# Patient Record
Sex: Female | Born: 1990 | Race: White | Hispanic: No | Marital: Single | Smoking: Current every day smoker
Health system: Southern US, Community
[De-identification: ages and names within clinical notes are randomized; demographics above are authoritative.]

## PROBLEM LIST (undated history)

## (undated) DIAGNOSIS — N289 Disorder of kidney and ureter, unspecified: Secondary | ICD-10-CM

## (undated) DIAGNOSIS — F1910 Other psychoactive substance abuse, uncomplicated: Secondary | ICD-10-CM

## (undated) DIAGNOSIS — K8020 Calculus of gallbladder without cholecystitis without obstruction: Secondary | ICD-10-CM

## (undated) DIAGNOSIS — N831 Corpus luteum cyst of ovary, unspecified side: Secondary | ICD-10-CM

## (undated) HISTORY — PX: FRACTURE SURGERY: SHX138

## (undated) HISTORY — PX: JOINT REPLACEMENT: SHX530

## (undated) HISTORY — DX: Other psychoactive substance abuse, uncomplicated: F19.10

---
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
## 2014-06-30 ENCOUNTER — Emergency Department: Payer: Self-pay | Admitting: Emergency Medicine

## 2014-08-28 ENCOUNTER — Encounter: Payer: Self-pay | Admitting: General Practice

## 2014-08-28 ENCOUNTER — Emergency Department
Admission: EM | Admit: 2014-08-28 | Discharge: 2014-08-28 | Disposition: A | Discharge status: Home / Self Care | Payer: Self-pay | Attending: Emergency Medicine | Admitting: Emergency Medicine

## 2014-08-28 DIAGNOSIS — Y998 Other external cause status: Secondary | ICD-10-CM | POA: Insufficient documentation

## 2014-08-28 DIAGNOSIS — F419 Anxiety disorder, unspecified: Secondary | ICD-10-CM | POA: Insufficient documentation

## 2014-08-28 DIAGNOSIS — X58XXXA Exposure to other specified factors, initial encounter: Secondary | ICD-10-CM | POA: Insufficient documentation

## 2014-08-28 DIAGNOSIS — Z720 Tobacco use: Secondary | ICD-10-CM | POA: Insufficient documentation

## 2014-08-28 DIAGNOSIS — Y9389 Activity, other specified: Secondary | ICD-10-CM | POA: Insufficient documentation

## 2014-08-28 DIAGNOSIS — L02211 Cutaneous abscess of abdominal wall: Secondary | ICD-10-CM | POA: Insufficient documentation

## 2014-08-28 DIAGNOSIS — L02214 Cutaneous abscess of groin: Secondary | ICD-10-CM

## 2014-08-28 DIAGNOSIS — S0081XA Abrasion of other part of head, initial encounter: Secondary | ICD-10-CM | POA: Insufficient documentation

## 2014-08-28 DIAGNOSIS — Y9289 Other specified places as the place of occurrence of the external cause: Secondary | ICD-10-CM | POA: Insufficient documentation

## 2014-08-28 HISTORY — DX: Disorder of kidney and ureter, unspecified: N28.9

## 2014-08-28 MED ORDER — TRAMADOL HCL 50 MG PO TABS
50.0000 mg | ORAL_TABLET | Freq: Once | ORAL | Status: AC
  Administered 2014-08-28: 50 mg via ORAL

## 2014-08-28 MED ORDER — SULFAMETHOXAZOLE-TRIMETHOPRIM 800-160 MG PO TABS
ORAL_TABLET | ORAL | Status: AC
  Administered 2014-08-28: 1 via ORAL
  Filled 2014-08-28: qty 1

## 2014-08-28 MED ORDER — SULFAMETHOXAZOLE-TRIMETHOPRIM 800-160 MG PO TABS: 1.0000 | ORAL_TABLET | Freq: Two times a day (BID) | ORAL | Status: DC

## 2014-08-28 MED ORDER — TRAMADOL HCL 50 MG PO TABS: 50.0000 mg | ORAL_TABLET | Freq: Four times a day (QID) | ORAL | Status: DC | PRN

## 2014-08-28 MED ORDER — SULFAMETHOXAZOLE-TRIMETHOPRIM 800-160 MG PO TABS
1.0000 | ORAL_TABLET | Freq: Once | ORAL | Status: AC
  Administered 2014-08-28: 1 via ORAL

## 2014-08-28 MED ORDER — LIDOCAINE-EPINEPHRINE (PF) 1 %-1:200000 IJ SOLN
INTRAMUSCULAR | Status: AC
  Administered 2014-08-28: 20:00:00 via INTRADERMAL
  Filled 2014-08-28: qty 30

## 2014-08-28 MED ORDER — TRAMADOL HCL 50 MG PO TABS
ORAL_TABLET | ORAL | Status: AC
  Administered 2014-08-28: 50 mg via ORAL
  Filled 2014-08-28: qty 1

## 2014-08-28 NOTE — ED Provider Notes (Signed)
[facility] Emergency Department Provider Note  ____________________________________________  Time seen: Approximately 7:27 PM  I have reviewed the triage vital signs and the nursing notes.   HISTORY  Chief Complaint Abscess    HPI Teresa Lowe is a 24 y.o. female patient can tandem abscess to the right ankle area. Patient's status been there for 1 day. Patient states she's have abrasion to the bilateral maxillary area did not remember how they occurred. Patient state that she was taking liquid Xanax last night passed out and awakened with these complaints. Patient denies any fever she also denies any discharge from this area.  Past Medical History  Diagnosis Date  . Kidney disease     There are no active problems to display for this patient.   History reviewed. No pertinent past surgical history.  Current Outpatient Rx  Name  Route  Sig  Dispense  Refill  . sulfamethoxazole-trimethoprim (BACTRIM DS,SEPTRA DS) 800-160 MG per tablet   Oral   Take 1 tablet by mouth 2 (two) times daily.   20 tablet   0   . traMADol (ULTRAM) 50 MG tablet   Oral   Take 1 tablet (50 mg total) by mouth every 6 (six) hours as needed for moderate pain.   12 tablet   0     Allergies Review of patient's allergies indicates no known allergies.  No family history on file.  Social History History  Substance Use Topics  . Smoking status: Current Every Day Smoker -- 1.00 packs/day    Types: Cigarettes  . Smokeless tobacco: Never Used  . Alcohol Use: No    Review of Systems Constitutional: No fever/chills. Appears anxious Eyes: No visual changes. ENT: No sore throat. Cardiovascular: Denies chest pain. Respiratory: Denies shortness of breath. Gastrointestinal: No abdominal pain.  No nausea, no vomiting.  No diarrhea.  No constipation. Genitourinary: Negative for dysuria. Musculoskeletal: Negative for back pain. Skin: Negative for rash. Abscess to right inguinal  area Neurological: Negative for headaches, focal weakness or numbness.  10-point ROS otherwise negative.  ____________________________________________   PHYSICAL EXAM:  VITAL SIGNS: ED Triage Vitals  Enc Vitals Group     BP 08/28/14 1819 114/58 mmHg     Pulse Rate 08/28/14 1819 89     Resp 08/28/14 1819 17     Temp 08/28/14 1819 98.6 F (37 C)     Temp Source 08/28/14 1819 Oral     SpO2 08/28/14 1819 99 %     Weight 08/28/14 1819 140 lb (63.504 kg)     Height 08/28/14 1819 5\' 4"  (1.626 m)     Head Cir --      Peak Flow --      Pain Score 08/28/14 1819 9     Pain Loc --      Pain Edu? --      Excl. in GC? --     Constitutional: Alert and oriented. Well appearing and in no acute distress. Eyes: Conjunctivae are normal. PERRL. EOMI. Head: Atraumatic. Nose: No congestion/rhinnorhea. Mouth/Throat: Mucous membranes are moist.  Oropharynx non-erythematous. Neck: No stridor. No cervical spinal tenderness to palpation full nuchal range of motion of the neck. Hematological/Lymphatic/Immunilogical: No cervical lymphadenopathy. }Cardiovascular: Normal rate, regular rhythm. Grossly normal heart sounds.  Good peripheral circulation. Respiratory: Normal respiratory effort.  No retractions. Lungs CTAB. Gastrointestinal: Soft and nontender. No distention. No abdominal bruits. No CVA tenderness. Musculoskeletal: No lower extremity tenderness nor edema.  No joint effusions. Neurologic:  Normal speech and language.  No gross focal neurologic deficits are appreciated. Speech is normal. No gait instability. Skin:  Skin is warm, dry and intact. No rash noted.Nodular lesion right ankle area on erythematous. Abrasions to bilateral facial area.  Psychiatric: Mood and affect are normal. Speech and behavior are normal.  ____________________________________________   LABS (all labs ordered are listed, but only abnormal results are displayed)  Labs Reviewed  WOUND CULTURE    ____________________________________________  EKG   ____________________________________________  RADIOLOGY   ____________________________________________   PROCEDURES  Procedure(s) performed: See procedure note  Critical Care performed: No  _____ [facility] Emergency Department Provider Note  ____________________________________________  Time seen: Approximately 7:30 PM  I have reviewed the triage vital signs and the nursing notes.   HISTORY  Chief Complaint Abscess    HPI Teresa Lowe is a 24 y.o. female    Past Medical History  Diagnosis Date  . Kidney disease     There are no active problems to display for this patient.   History reviewed. No pertinent past surgical history.  Current Outpatient Rx  Name  Route  Sig  Dispense  Refill  . sulfamethoxazole-trimethoprim (BACTRIM DS,SEPTRA DS) 800-160 MG per tablet   Oral   Take 1 tablet by mouth 2 (two) times daily.   20 tablet   0   . traMADol (ULTRAM) 50 MG tablet   Oral   Take 1 tablet (50 mg total) by mouth every 6 (six) hours as needed for moderate pain.   12 tablet   0     Allergies Review of patient's allergies indicates no known allergies.  No family history on file.  Social History History  Substance Use Topics  . Smoking status: Current Every Day Smoker -- 1.00 packs/day    Types: Cigarettes  . Smokeless tobacco: Never Used  . Alcohol Use: No    Review of Systems Constitutional: No fever/chills Eyes: No visual changes. ENT: No sore throat. Cardiovascular: Denies chest pain. Respiratory: Denies shortness of breath. Gastrointestinal: No abdominal pain.  No nausea, no vomiting.  No diarrhea.  No constipation. Genitourinary: Negative for dysuria. Musculoskeletal: Negative for back pain. Skin: Negative for rash. Neurological: Negative for headaches, focal weakness or numbness.  10-point ROS otherwise  negative.  ____________________________________________   PHYSICAL EXAM:  VITAL SIGNS: ED Triage Vitals  Enc Vitals Group     BP 08/28/14 1819 114/58 mmHg     Pulse Rate 08/28/14 1819 89     Resp 08/28/14 1819 17     Temp 08/28/14 1819 98.6 F (37 C)     Temp Source 08/28/14 1819 Oral     SpO2 08/28/14 1819 99 %     Weight 08/28/14 1819 140 lb (63.504 kg)     Height 08/28/14 1819  (1.626 m)     Head Cir --      Peak Flow --      Pain Score 08/28/14 1819 9     Pain Loc --      Pain Edu? --      Excl. in GC? --     Constitutional: Alert and oriented. Well appearing and in no acute distress. Eyes: Conjunctivae are normal. PERRL. EOMI. Head: Atraumatic. Nose: No congestion/rhinnorhea. Mouth/Throat: Mucous membranes are moist.  Oropharynx non-erythematous. Neck: No stridor.   Cardiovascular: Normal rate, regular rhythm. Grossly normal heart sounds.  Good peripheral circulation. Respiratory: Normal respiratory effort.  No retractions. Lungs CTAB. Gastrointestinal: Soft and nontender. No distention. No abdominal bruits. No CVA tenderness.  Musculoskeletal: No lower extremity tenderness nor edema.  No joint effusions. Neurologic:  Normal speech and language. No gross focal neurologic deficits are appreciated. Speech is normal. No gait instability. Skin:  Skin is warm, dry and intact. No rash noted. Psychiatric: Mood and affect are normal. Speech and behavior are normal.  ____________________________________________   LABS (all labs ordered are listed, but only abnormal results are displayed)  Labs Reviewed  WOUND CULTURE   ____________________________________________  EKG   ____________________________________________  RADIOLOGY   ____________________________________________   PROCEDURES  Procedure(s) performed: NoneSee procedure note.  Critical Care performed: No  ____________________________________________   INITIAL IMPRESSION / ASSESSMENT AND  PLAN / ED COURSE  Pertinent labs & imaging results that were available during my care of the patient were reviewed by me and considered in my medical decision making (see chart for details).  Groin abscess  ____________________________________________   FINAL CLINICAL IMPRESSION(S) / ED DIAGNOSES  Final diagnoses:  Soft tissue abscess of inguinal region   INCISION AND DRAINAGE Performed by: Joni Reiningonald K Smith Consent: Verbal consent obtained. Risks and benefits: risks, benefits and alternatives were discussed Type: abscess  Body area:Right groin Anesthesia: local infiltration  Incision was made with a scalpel.  Local anesthetic: lidocaine 1% without epinephrine  Anesthetic total 5 ML's ml  Complexity: complex Blunt dissection to break up loculations  Drainage: purulent small  Drainage amount: 1 cc   Packing material: 1/4 in iodoform gauze  Patient tolerance: Patient tolerated the procedure well with no immediate complications.    _______________________________________   INITIAL IMPRESSION / ASSESSMENT AND PLAN / ED COURSE  Pertinent labs & imaging results that were available during my care of the patient were reviewed by me and considered in my medical decision making (see chart for details).  Right inguinal abscess ____________________________________________   FINAL CLINICAL IMPRESSION(S) / ED DIAGNOSES  Final diagnoses:  Soft tissue abscess of inguinal region      Joni ReiningRonald K Smith, PA-C 08/28/14 1957  Myrna Blazeravid Matthew Schaevitz, MD 08/28/14 2127
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
## 2014-08-28 NOTE — ED Notes (Signed)
Provider in room to assess patient at this time.  Will continue to monitor.

## 2014-08-28 NOTE — ED Notes (Signed)
Pt. Arrived to ed from home. Reports of abscess to inner right thigh. Pt reports she recognized the area this AM. Alert and Oriented.  Denies fever at home.

## 2014-08-28 NOTE — Discharge Instructions (Signed)

## 2014-09-01 LAB — WOUND CULTURE: SPECIAL REQUESTS: NORMAL

## 2014-09-02 ENCOUNTER — Emergency Department
Admission: EM | Admit: 2014-09-02 | Discharge: 2014-09-02 | Disposition: A | Discharge status: Home / Self Care | Payer: Self-pay | Attending: Emergency Medicine | Admitting: Emergency Medicine

## 2014-09-02 ENCOUNTER — Encounter: Payer: Self-pay | Admitting: Emergency Medicine

## 2014-09-02 DIAGNOSIS — Z792 Long term (current) use of antibiotics: Secondary | ICD-10-CM | POA: Insufficient documentation

## 2014-09-02 DIAGNOSIS — L02214 Cutaneous abscess of groin: Secondary | ICD-10-CM | POA: Insufficient documentation

## 2014-09-02 DIAGNOSIS — Z720 Tobacco use: Secondary | ICD-10-CM | POA: Insufficient documentation

## 2014-09-02 DIAGNOSIS — Z4801 Encounter for change or removal of surgical wound dressing: Secondary | ICD-10-CM | POA: Insufficient documentation

## 2014-09-02 NOTE — ED Provider Notes (Signed)
[facility] Emergency Department Provider Note  ____________________________________________  Time seen: Approximately 4:01 PM  I have reviewed the triage vital signs and the nursing notes.   HISTORY  Chief Complaint Wound Check   HPI Teresa Lowe is a 24 y.o. female who presents to the ER for evaluation of a wound check. Patient states she had packing placed in her right groin approximately 2 days ago. And is here to have it evaluated. Denies any complaints at this time. Patient states wound is feeling much better.   Past Medical History  Diagnosis Date  . Kidney disease     There are no active problems to display for this patient.   History reviewed. No pertinent past surgical history.  Current Outpatient Rx  Name  Route  Sig  Dispense  Refill  . sulfamethoxazole-trimethoprim (BACTRIM DS,SEPTRA DS) 800-160 MG per tablet   Oral   Take 1 tablet by mouth 2 (two) times daily.   20 tablet   0   . traMADol (ULTRAM) 50 MG tablet   Oral   Take 1 tablet (50 mg total) by mouth every 6 (six) hours as needed for moderate pain.   12 tablet   0     Allergies Review of patient's allergies indicates no known allergies.  History reviewed. No pertinent family history.  Social History History  Substance Use Topics  . Smoking status: Current Every Day Smoker -- 1.00 packs/day    Types: Cigarettes  . Smokeless tobacco: Never Used  . Alcohol Use: No    Review of System Constitutional: No fever/chills Skin: Right inguinal area wound noted. No erythema or drainage noted packing not in place at this time. Wound edges healing.  10-point ROS otherwise negative.  ____________________________________________   PHYSICAL EXAM:  VITAL SIGNS: ED Triage Vitals  Enc Vitals Group     BP 09/02/14 0833 113/67 mmHg     Pulse Rate 09/02/14 0833 77     Resp 09/02/14 0833 16     Temp 09/02/14 0833 98 F (36.7 C)     Temp src --      SpO2 09/02/14 0833 98 %      Weight 09/02/14 0833 145 lb (65.772 kg)     Height 09/02/14 0833 5\' 4"  (1.626 m)     Head Cir --      Peak Flow --      Pain Score 09/02/14 0834 7     Pain Loc --      Pain Edu? --      Excl. in GC? --     Constitutional: Alert and oriented. Well appearing and in no acute distress. Skin: Right inguinal area wound noted. No erythema or drainage noted packing not in place at this time. Wound edges healing. Psychiatric: Mood and affect are normal. Speech and behavior are normal.  ____________________________________________   LABS (all labs ordered are listed, but only abnormal results are displayed)  Labs Reviewed - No data to display ____________________________________________   ____________________________________________   PROCEDURES  Procedure(s) performed: None  Critical Care performed: No  ____________________________________________   INITIAL IMPRESSION / ASSESSMENT AND PLAN / ED COURSE  Pertinent labs & imaging results that were available during my care of the patient were reviewed by me and considered in my medical decision making (see chart for details).DISCUSSED CLINICAL FINDINGS. WOUND CONTACT WELL HEALING. PATIENT UNDERSTANDS TO RETURN TO THE ER AS NEEDED OR IF SYMPTOMS WORSEN._________________________________________   FINAL CLINICAL IMPRESSION(S) / ED DIAGNOSES  Final diagnoses:  Abscess of groin, right      Evangeline Dakin, PA-C 09/02/14 1608  Phineas Semen, MD 09/03/14 [phone]

## 2014-09-02 NOTE — ED Notes (Signed)
Here for packing removal from right thigh abcess

## 2014-09-02 NOTE — ED Notes (Signed)
Pt reports having I&D 2 days ago on abscess, here for wound check

## 2014-09-02 NOTE — Discharge Instructions (Signed)

## 2015-06-24 ENCOUNTER — Emergency Department
Admission: EM | Admit: 2015-06-24 | Discharge: 2015-06-24 | Disposition: A | Discharge status: Home / Self Care | Payer: Self-pay | Attending: Emergency Medicine | Admitting: Emergency Medicine

## 2015-06-24 ENCOUNTER — Encounter: Payer: Self-pay | Admitting: Medical Oncology

## 2015-06-24 DIAGNOSIS — Z792 Long term (current) use of antibiotics: Secondary | ICD-10-CM | POA: Insufficient documentation

## 2015-06-24 DIAGNOSIS — J101 Influenza due to other identified influenza virus with other respiratory manifestations: Secondary | ICD-10-CM | POA: Insufficient documentation

## 2015-06-24 DIAGNOSIS — F17210 Nicotine dependence, cigarettes, uncomplicated: Secondary | ICD-10-CM | POA: Insufficient documentation

## 2015-06-24 LAB — RAPID INFLUENZA A&B ANTIGENS (ARMC ONLY): INFLUENZA B (ARMC): NEGATIVE

## 2015-06-24 LAB — RAPID INFLUENZA A&B ANTIGENS: Influenza A (ARMC): POSITIVE — AB

## 2015-06-24 MED ORDER — OSELTAMIVIR PHOSPHATE 75 MG PO CAPS: 75.0000 mg | ORAL_CAPSULE | Freq: Two times a day (BID) | ORAL | Status: DC

## 2015-06-24 NOTE — Discharge Instructions (Signed)
Influenza, Adult Influenza (flu) is an infection in the mouth, nose, and throat (respiratory tract) caused by a virus. The flu can make you feel very ill. Influenza spreads easily from person to person (contagious).  HOME CARE   Only take medicines as told by your doctor.  Use a cool mist humidifier to make breathing easier.  Get plenty of rest until your fever goes away. This usually takes 3 to 4 days.  Drink enough fluids to keep your pee (urine) clear or pale yellow.  Cover your mouth and nose when you cough or sneeze.  Wash your hands well to avoid spreading the flu.  Stay home from work or school until your fever has been gone for at least 1 full day.  Get a flu shot every year. GET HELP RIGHT AWAY IF:   You have trouble breathing or feel short of breath.  Your skin or nails turn blue.  You have severe neck pain or stiffness.  You have a severe headache, facial pain, or earache.  Your fever gets worse or keeps coming back.  You feel sick to your stomach (nauseous), throw up (vomit), or have watery poop (diarrhea).  You have chest pain.  You have a deep cough that gets worse, or you cough up more thick spit (mucus). MAKE SURE YOU:   Understand these instructions.  Will watch your condition.  Will get help right away if you are not doing well or get worse.   This information is not intended to replace advice given to you by your health care provider. Make sure you discuss any questions you have with your health care provider.   Document Released: 01/05/2008 Document Revised: 04/18/2014 Document Reviewed: 06/27/2011 Elsevier Interactive Patient Education 2016 [employer]  

## 2015-06-24 NOTE — ED Notes (Signed)
Pt reports nasal congestion, sore throat and cold sx's since last night.

## 2015-06-24 NOTE — ED Provider Notes (Signed)
[facility] Emergency Department Provider Note  ____________________________________________  Time seen: Approximately 11:08 AM  I have reviewed the triage vital signs and the nursing notes.   HISTORY  Chief Complaint Sore Throat and Nasal Congestion   HPI Teresa Lowe is a 25 y.o. female this is a complaint of sudden onset of nasal drainage and sore throat last evening. Patient states she was exposed to someone with flu and is uncertain whether this is what she is experiencing or weather is cold symptoms. Patient states she has chills but is not aware of any fever. There is been no nausea, vomiting or diarrhea.Currently she rates her pain as 8 out of 10.   Past Medical History  Diagnosis Date  . Kidney disease     There are no active problems to display for this patient.   History reviewed. No pertinent past surgical history.  Current Outpatient Rx  Name  Route  Sig  Dispense  Refill  . oseltamivir (TAMIFLU) 75 MG capsule   Oral   Take 1 capsule (75 mg total) by mouth 2 (two) times daily.   10 capsule   0   . sulfamethoxazole-trimethoprim (BACTRIM DS,SEPTRA DS) 800-160 MG per tablet   Oral   Take 1 tablet by mouth 2 (two) times daily.   20 tablet   0   . traMADol (ULTRAM) 50 MG tablet   Oral   Take 1 tablet (50 mg total) by mouth every 6 (six) hours as needed for moderate pain.   12 tablet   0     Allergies Review of patient's allergies indicates no known allergies.  No family history on file.  Social History Social History  Substance Use Topics  . Smoking status: Current Every Day Smoker -- 1.00 packs/day    Types: Cigarettes  . Smokeless tobacco: Never Used  . Alcohol Use: No    Review of Systems Constitutional: No fever/positive chills Eyes: No visual changes. ENT: Positive sore throat. Cardiovascular: Denies chest pain. Respiratory: Denies shortness of breath. Gastrointestinal: No abdominal pain.  No nausea, no  vomiting.  No diarrhea.  No constipation. Genitourinary: Negative for dysuria. Musculoskeletal: Positive body aches. Skin: Negative for rash. Neurological: Negative for headaches, focal weakness or numbness.  10-point ROS otherwise negative.  ____________________________________________   PHYSICAL EXAM:  VITAL SIGNS: ED Triage Vitals  Enc Vitals Group     BP 06/24/15 1033 133/74 mmHg     Pulse Rate 06/24/15 1033 83     Resp 06/24/15 1033 17     Temp 06/24/15 1033 97.9 F (36.6 C)     Temp Source 06/24/15 1033 Oral     SpO2 06/24/15 1033 97 %     Weight 06/24/15 1033 160 lb (72.576 kg)     Height 06/24/15 1033  (1.626 m)     Head Cir --      Peak Flow --      Pain Score 06/24/15 1033 8     Pain Loc --      Pain Edu? --      Excl. in GC? --     Constitutional: Alert and oriented. Well appearing and in no acute distress. Eyes: Conjunctivae are normal. PERRL. EOMI. Head: Atraumatic. Nose: Mild congestion/rhinnorhea. Mouth/Throat: Mucous membranes are moist.  Oropharynx non-erythematous. Neck: No stridor.  Supple. Hematological/Lymphatic/Immunilogical: No cervical lymphadenopathy. Cardiovascular: Normal rate, regular rhythm. Grossly normal heart sounds.  Good peripheral circulation. Respiratory: Normal respiratory effort.  No retractions. Lungs CTAB. Gastrointestinal: Soft and nontender.  No distention. Musculoskeletal: Moves upper and lower extremities without any difficulty and normal gait was noted. Neurologic:  Normal speech and language. No gross focal neurologic deficits are appreciated. No gait instability. Skin:  Skin is warm, dry and intact. No rash noted. Psychiatric: Mood and affect are normal. Speech and behavior are normal.  ____________________________________________   LABS (all labs ordered are listed, but only abnormal results are displayed)  Labs Reviewed  RAPID INFLUENZA A&B ANTIGENS ([facility] ONLY) - Abnormal; Notable for the following:     Influenza A ([facility]) POSITIVE (*)    All other components within normal limits     PROCEDURES  Procedure(s) performed: None  Critical Care performed: No  ____________________________________________   INITIAL IMPRESSION / ASSESSMENT AND PLAN / ED COURSE  Pertinent labs & imaging results that were available during my care of the patient were reviewed by me and considered in my medical decision making (see chart for details).  Patient was informed that she is positive for influenza A. Because the time of onset she would be a candidate for Tamiflu and agrees. She is to continue taking Tylenol or Motrin as needed for fever or body aches. [facility] any continued problems. ____________________________________________   FINAL CLINICAL IMPRESSION(S) / ED DIAGNOSES  Final diagnoses:  Influenza A      Tommi RumpsRhonda L Summers, PA-C 06/24/15 1234  Phineas SemenGraydon Goodman, MD 06/24/15 [phone]

## 2015-06-24 NOTE — ED Notes (Signed)
Pt discharged home after verbalizing understanding of discharge instructions; nad noted. 

## 2015-06-24 NOTE — ED Notes (Signed)
Pt reports nasal drainage and sore throat started last night. Pt states has been exposed to someone with the flu. Redness noted to pt throat.

## 2015-08-21 ENCOUNTER — Emergency Department (HOSPITAL_COMMUNITY)
Admission: EM | Admit: 2015-08-21 | Discharge: 2015-08-21 | Disposition: A | Discharge status: Home / Self Care | Payer: Self-pay | Attending: Emergency Medicine | Admitting: Emergency Medicine

## 2015-08-21 ENCOUNTER — Other Ambulatory Visit: Payer: Self-pay

## 2015-08-21 ENCOUNTER — Emergency Department (HOSPITAL_COMMUNITY): Payer: Self-pay

## 2015-08-21 ENCOUNTER — Encounter (HOSPITAL_COMMUNITY): Payer: Self-pay | Admitting: Emergency Medicine

## 2015-08-21 DIAGNOSIS — Y92410 Unspecified street and highway as the place of occurrence of the external cause: Secondary | ICD-10-CM | POA: Insufficient documentation

## 2015-08-21 DIAGNOSIS — S32425A Nondisplaced fracture of posterior wall of left acetabulum, initial encounter for closed fracture: Secondary | ICD-10-CM | POA: Insufficient documentation

## 2015-08-21 DIAGNOSIS — S32592A Other specified fracture of left pubis, initial encounter for closed fracture: Secondary | ICD-10-CM | POA: Insufficient documentation

## 2015-08-21 DIAGNOSIS — F17200 Nicotine dependence, unspecified, uncomplicated: Secondary | ICD-10-CM | POA: Insufficient documentation

## 2015-08-21 DIAGNOSIS — S329XXA Fracture of unspecified parts of lumbosacral spine and pelvis, initial encounter for closed fracture: Secondary | ICD-10-CM

## 2015-08-21 DIAGNOSIS — S32402A Unspecified fracture of left acetabulum, initial encounter for closed fracture: Secondary | ICD-10-CM

## 2015-08-21 DIAGNOSIS — Y9389 Activity, other specified: Secondary | ICD-10-CM | POA: Insufficient documentation

## 2015-08-21 DIAGNOSIS — Z3202 Encounter for pregnancy test, result negative: Secondary | ICD-10-CM | POA: Insufficient documentation

## 2015-08-21 DIAGNOSIS — S29002A Unspecified injury of muscle and tendon of back wall of thorax, initial encounter: Secondary | ICD-10-CM | POA: Insufficient documentation

## 2015-08-21 DIAGNOSIS — Z87448 Personal history of other diseases of urinary system: Secondary | ICD-10-CM | POA: Insufficient documentation

## 2015-08-21 DIAGNOSIS — S3992XA Unspecified injury of lower back, initial encounter: Secondary | ICD-10-CM | POA: Insufficient documentation

## 2015-08-21 DIAGNOSIS — S3991XA Unspecified injury of abdomen, initial encounter: Secondary | ICD-10-CM | POA: Insufficient documentation

## 2015-08-21 DIAGNOSIS — Y998 Other external cause status: Secondary | ICD-10-CM | POA: Insufficient documentation

## 2015-08-21 HISTORY — DX: Disorder of kidney and ureter, unspecified: N28.9

## 2015-08-21 LAB — URINALYSIS, ROUTINE W REFLEX MICROSCOPIC
Bilirubin Urine: NEGATIVE
GLUCOSE, UA: NEGATIVE mg/dL
Ketones, ur: NEGATIVE mg/dL
Leukocytes, UA: NEGATIVE
Nitrite: POSITIVE — AB
Protein, ur: 100 mg/dL — AB
SPECIFIC GRAVITY, URINE: 1.025 (ref 1.005–1.030)
pH: 6 (ref 5.0–8.0)

## 2015-08-21 LAB — COMPREHENSIVE METABOLIC PANEL
ALK PHOS: 63 U/L (ref 38–126)
ALT: 43 U/L (ref 14–54)
ANION GAP: 14 (ref 5–15)
AST: 50 U/L — AB (ref 15–41)
Albumin: 3.8 g/dL (ref 3.5–5.0)
BUN: 9 mg/dL (ref 6–20)
CALCIUM: 9.6 mg/dL (ref 8.9–10.3)
CO2: 23 mmol/L (ref 22–32)
Chloride: 104 mmol/L (ref 101–111)
Creatinine, Ser: 0.64 mg/dL (ref 0.44–1.00)
GFR calc Af Amer: >60 mL/min (ref >60)
GFR calc non Af Amer: >60 mL/min (ref >60)
GLUCOSE: 135 mg/dL — AB (ref 65–99)
Potassium: 3.5 mmol/L (ref 3.5–5.1)
Sodium: 141 mmol/L (ref 135–145)
Total Bilirubin: 0.6 mg/dL (ref 0.3–1.2)
Total Protein: 7.1 g/dL (ref 6.5–8.1)

## 2015-08-21 LAB — CBC
HEMATOCRIT: 41.7 % (ref 36.0–46.0)
HEMOGLOBIN: 13.9 g/dL (ref 12.0–15.0)
MCH: 29.4 pg (ref 26.0–34.0)
MCHC: 33.3 g/dL (ref 30.0–36.0)
MCV: 88.2 fL (ref 78.0–100.0)
Platelets: 315 10*3/uL (ref 150–400)
RBC: 4.73 MIL/uL (ref 3.87–5.11)
RDW: 13.8 % (ref 11.5–15.5)
WBC: 9.3 10*3/uL (ref 4.0–10.5)

## 2015-08-21 LAB — URINE MICROSCOPIC-ADD ON

## 2015-08-21 LAB — I-STAT CG4 LACTIC ACID, ED: Lactic Acid, Venous: 1.73 mmol/L (ref 0.5–2.0)

## 2015-08-21 LAB — PROTIME-INR
INR: 1.13 (ref 0.00–1.49)
Prothrombin Time: 14.7 seconds (ref 11.6–15.2)

## 2015-08-21 LAB — ETHANOL: Alcohol, Ethyl (B): <5 mg/dL (ref <5)

## 2015-08-21 LAB — I-STAT BETA HCG BLOOD, ED (MC, WL, AP ONLY)

## 2015-08-21 MED ORDER — FENTANYL CITRATE (PF) 100 MCG/2ML IJ SOLN
100.0000 ug | Freq: Once | INTRAMUSCULAR | Status: AC
  Administered 2015-08-21: 100 ug via INTRAVENOUS

## 2015-08-21 MED ORDER — IOPAMIDOL (ISOVUE-300) INJECTION 61%
INTRAVENOUS | Status: AC
  Administered 2015-08-21: 100 mL
  Filled 2015-08-21: qty 100

## 2015-08-21 MED ORDER — FENTANYL CITRATE (PF) 100 MCG/2ML IJ SOLN
INTRAMUSCULAR | Status: AC
  Filled 2015-08-21: qty 2

## 2015-08-21 MED ORDER — ONDANSETRON HCL 4 MG/2ML IJ SOLN
INTRAMUSCULAR | Status: AC
  Filled 2015-08-21: qty 2

## 2015-08-21 MED ORDER — SODIUM CHLORIDE 0.9 % IV BOLUS (SEPSIS)
1000.0000 mL | Freq: Once | INTRAVENOUS | Status: AC
  Administered 2015-08-21: 1000 mL via INTRAVENOUS

## 2015-08-21 MED ORDER — FENTANYL CITRATE (PF) 100 MCG/2ML IJ SOLN
INTRAMUSCULAR | Status: AC
  Administered 2015-08-21: 100 ug
  Filled 2015-08-21: qty 2

## 2015-08-21 MED ORDER — FENTANYL CITRATE (PF) 100 MCG/2ML IJ SOLN
50.0000 ug | INTRAMUSCULAR | Status: DC | PRN
  Administered 2015-08-21: 50 ug via INTRAVENOUS
  Filled 2015-08-21: qty 2

## 2015-08-21 MED ORDER — HYDROMORPHONE HCL 1 MG/ML IJ SOLN
1.0000 mg | Freq: Once | INTRAMUSCULAR | Status: AC
  Administered 2015-08-21: 1 mg via INTRAVENOUS
  Filled 2015-08-21: qty 1

## 2015-08-21 MED ORDER — ONDANSETRON HCL 4 MG/2ML IJ SOLN
4.0000 mg | Freq: Once | INTRAMUSCULAR | Status: AC
  Administered 2015-08-21: 4 mg via INTRAVENOUS
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
## 2015-08-21 NOTE — Discharge Instructions (Signed)
Do not walk on your pelvic fracture.  See an orthopedic surgeon as soon as possible.

## 2015-08-21 NOTE — ED Notes (Signed)
Pt. currently at CT scan , EDP spoke with pt. regarding pt.'s plan to leave the emergency room - explained the importance of staying and have the scan done for her safety.
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
## 2015-08-21 NOTE — ED Notes (Signed)
Family at beside. Family given emotional support. 

## 2015-08-21 NOTE — ED Notes (Signed)
Pt transported to CT ?

## 2015-08-22 ENCOUNTER — Emergency Department
Admission: EM | Admit: 2015-08-22 | Discharge: 2015-08-22 | Disposition: A | Discharge status: Other Acute Inpatient Hospital | Payer: Self-pay | Attending: Emergency Medicine | Admitting: Emergency Medicine

## 2015-08-22 ENCOUNTER — Encounter: Payer: Self-pay | Admitting: Emergency Medicine

## 2015-08-22 DIAGNOSIS — N289 Disorder of kidney and ureter, unspecified: Secondary | ICD-10-CM | POA: Insufficient documentation

## 2015-08-22 DIAGNOSIS — F17210 Nicotine dependence, cigarettes, uncomplicated: Secondary | ICD-10-CM | POA: Insufficient documentation

## 2015-08-22 DIAGNOSIS — Y999 Unspecified external cause status: Secondary | ICD-10-CM | POA: Insufficient documentation

## 2015-08-22 DIAGNOSIS — Y9389 Activity, other specified: Secondary | ICD-10-CM | POA: Insufficient documentation

## 2015-08-22 DIAGNOSIS — Y92410 Unspecified street and highway as the place of occurrence of the external cause: Secondary | ICD-10-CM | POA: Insufficient documentation

## 2015-08-22 DIAGNOSIS — S32402A Unspecified fracture of left acetabulum, initial encounter for closed fracture: Secondary | ICD-10-CM | POA: Insufficient documentation

## 2015-08-22 HISTORY — DX: Calculus of gallbladder without cholecystitis without obstruction: K80.20

## 2015-08-22 MED ORDER — MORPHINE SULFATE (PF) 4 MG/ML IV SOLN
4.0000 mg | Freq: Once | INTRAVENOUS | Status: AC
  Administered 2015-08-22: 4 mg via INTRAVENOUS

## 2015-08-22 MED ORDER — MORPHINE SULFATE (PF) 4 MG/ML IV SOLN
INTRAVENOUS | Status: AC
  Filled 2015-08-22: qty 1

## 2015-08-22 MED ORDER — ONDANSETRON HCL 4 MG/2ML IJ SOLN
INTRAMUSCULAR | Status: AC
  Filled 2015-08-22: qty 2

## 2015-08-22 MED ORDER — ONDANSETRON HCL 4 MG/2ML IJ SOLN
4.0000 mg | Freq: Once | INTRAMUSCULAR | Status: AC
  Administered 2015-08-22: 4 mg via INTRAVENOUS
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
## 2015-08-22 NOTE — ED Notes (Signed)
Spoke with Dr. Scotty CourtStafford and asked him to review labs and imaging that [facility] had done at [facility]. MD reviewed results and is aware of the fracture. MD ok with patient waiting in the lobby at this time; injuries stable and patient does not need to come back in front of longest wait times per MD. First nurse made aware to monitor patient and follow up with charge as needed.

## 2015-08-24 ENCOUNTER — Encounter: Payer: Self-pay | Admitting: Medical Oncology

## 2015-10-02 DIAGNOSIS — Z9119 Patient's noncompliance with other medical treatment and regimen: Secondary | ICD-10-CM | POA: Insufficient documentation

## 2015-10-02 DIAGNOSIS — Z91199 Patient's noncompliance with other medical treatment and regimen due to unspecified reason: Secondary | ICD-10-CM | POA: Insufficient documentation

## 2015-10-02 DIAGNOSIS — S32453A Displaced transverse fracture of unspecified acetabulum, initial encounter for closed fracture: Secondary | ICD-10-CM | POA: Insufficient documentation

## 2015-10-07 ENCOUNTER — Emergency Department
Admission: EM | Admit: 2015-10-07 | Discharge: 2015-10-07 | Disposition: A | Discharge status: Home / Self Care | Payer: No Typology Code available for payment source | Attending: Emergency Medicine | Admitting: Emergency Medicine

## 2015-10-07 DIAGNOSIS — Z79899 Other long term (current) drug therapy: Secondary | ICD-10-CM | POA: Insufficient documentation

## 2015-10-07 DIAGNOSIS — G8918 Other acute postprocedural pain: Secondary | ICD-10-CM | POA: Insufficient documentation

## 2015-10-07 DIAGNOSIS — Z792 Long term (current) use of antibiotics: Secondary | ICD-10-CM | POA: Insufficient documentation

## 2015-10-07 DIAGNOSIS — F17210 Nicotine dependence, cigarettes, uncomplicated: Secondary | ICD-10-CM | POA: Insufficient documentation

## 2015-10-07 DIAGNOSIS — M25552 Pain in left hip: Secondary | ICD-10-CM | POA: Insufficient documentation

## 2015-10-07 MED ORDER — HYDROMORPHONE HCL 1 MG/ML IJ SOLN
1.0000 mg | Freq: Once | INTRAMUSCULAR | Status: AC
  Administered 2015-10-07: 1 mg via INTRAMUSCULAR
  Filled 2015-10-07: qty 1

## 2015-10-07 NOTE — ED Provider Notes (Signed)
[facility] Emergency Department Provider Note        Time seen: ----------------------------------------- 9:47 AM on 10/07/2015 -----------------------------------------    I have reviewed the triage vital signs and the nursing notes.   HISTORY  Chief Complaint Post-op Problem    HPI Teresa Lowe is a 25 y.o. female who presents to ER for intractable pain. Patient reportedly just had pelvic surgery and was [facility]. She last took oxycodone this morning, is here because of uncontrolled pain. She states is severe in the left hip, nothing makes it better or any movement of the head makes it worse.   Past Medical History  Diagnosis Date  . Kidney disease   . Renal insufficiency   . Gallstones     There are no active problems to display for this patient.   Past Surgical History  Procedure Laterality Date  . Fracture surgery      left hip  . Cesarean section      Allergies Review of patient's allergies indicates no known allergies.  Social History Social History  Substance Use Topics  . Smoking status: Current Every Day Smoker -- 1.00 packs/day    Types: Cigarettes  . Smokeless tobacco: None  . Alcohol Use: Yes     Comment: occasionally    Review of Systems Constitutional: Negative for fever. Cardiovascular: Negative for chest pain. Respiratory: Negative for shortness of breath. Gastrointestinal: Negative for abdominal pain, vomiting and diarrhea. Genitourinary: Negative for dysuria. Musculoskeletal: Positive for left hip and pelvic pain Skin: Negative for rash. Neurological: Negative for headaches, focal weakness or numbness.  10-point ROS otherwise negative.  ____________________________________________   PHYSICAL EXAM:  VITAL SIGNS: ED Triage Vitals  Enc Vitals Group     BP 10/07/15 0926 124/74 mmHg     Pulse Rate 10/07/15 0926 82     Resp 10/07/15 0926 20     Temp 10/07/15 0926 98.1 F (36.7 C)      Temp Source 10/07/15 0926 Oral     SpO2 10/07/15 0926 100 %     Weight 10/07/15 0926 150 lb (68.04 kg)     Height 10/07/15 0926 5\' 4"  (1.626 m)     Head Cir --      Peak Flow --      Pain Score 10/07/15 0926 10     Pain Loc --      Pain Edu? --      Excl. in GC? --     Constitutional: Alert and oriented. Well appearing and in no distress. Eyes: Conjunctivae are normal. PERRL. Normal extraocular movements. Cardiovascular: Normal rate, regular rhythm. No murmurs, rubs, or gallops. Respiratory: Normal respiratory effort without tachypnea nor retractions. Breath sounds are clear and equal bilaterally. No wheezes/rales/rhonchi. Gastrointestinal: Soft and nontender. Normal bowel sounds Musculoskeletal: Tenderness noted around the left pelvic area. Surgical incision sites are appreciated in the suprapubic area and around the left hip. Surgical sites are unremarkable. Pain with range of motion of the left lower extremity Neurologic:  Normal speech and language. No gross focal neurologic deficits are appreciated.  Skin:  Skin is warm, dry and intact. No rash noted. Psychiatric: Mood and affect are normal. Speech and behavior are normal.  ____________________________________________  ED COURSE:  Pertinent labs & imaging results that were available during my care of the patient were reviewed by me and considered in my medical decision making (see chart for details). Patient resisted uncontrolled pain, I will [facility], provide pain  control. ____________________________________________  FINAL ASSESSMENT AND PLAN  Postoperative pain  Plan: Patient with postoperative pain that appears to be within normal limits. She realized here that she was taking the muscle relaxant rather than the oxycodone. I have advised taking the oxycodone, she was given a shot of Dilaudid here for pain. She is stable for discharge.   Emily FilbertWilliams, Harlon Kutner E, MD   Note: This dictation was  prepared with Dragon dictation. Any transcriptional errors that result from this process are unintentional   Emily FilbertJonathan E Jonatha Gagen, MD 10/07/15 [phone]

## 2015-10-07 NOTE — ED Notes (Addendum)
Pt states that she last took oxycodone at 0800. But with reviewing medications; pt realizes that she has been taking muscle relaxer instead of oxycodone. Pt laughing when realizing that she has not been taking the prescribed oxycodone. MD at bedside.

## 2015-10-07 NOTE — Discharge Instructions (Signed)
Pain Relief Preoperatively and Postoperatively  If you have questions, problems, or concerns about the pain that you may feel after surgery, let your health care provider know. Patients have the right to assessment and management of pain. Severe pain after surgery--and the fear or anxiety associated with that pain--may cause extreme discomfort that:  · Prevents sleep.  · Decreases the ability to breathe deeply and to cough. This can result in pneumonia or other upper airway infections.  · Causes the heart to beat more quickly and the blood pressure to be higher.  · Increases the risk for constipation and bloating.  · Decreases the ability of wounds to heal.  · May result in depression, increased anxiety, and feelings of helplessness.  Relieving pain before surgery (preoperatively) is also important because it lessens pain that you have after surgery (postoperatively). Patients who receive pain relief both before and after surgery experience greater pain relief than those who receive pain relief only after surgery. Let your health care provider know if you are having uncontrolled pain. This is very important. Pain after surgery is more difficult to manage if it is severe, so receiving prompt and adequate treatment of acute pain is necessary. If you become constipated after taking pain medicine, drink more liquids if you can. Your health care provider may have you take a mild laxative.  PAIN CONTROL METHODS  Your health care providers follow policies and procedures about the management of your pain. These guidelines should be explained to you before surgery. Plans for pain control after surgery must be decided upon by you and your health care provider and put into use with your full understanding and agreement. Do not be afraid to ask questions about the care that you are receiving.  Your health care providers will attempt to control your pain in various ways, and these methods may be used together (multimodal  analgesia). Using this approach has many benefits for you, including being able to eat, move around, and leave the hospital sooner.  As-Needed Pain Control  · You may be given pain medicine through an IV tube or as a pill or liquid that you can swallow. Let your health care provider know when you are having pain, and he or she will give you the pain medicine that is ordered for you.  IV Patient-Controlled Analgesia (PCA) Pump  · You can receive your pain medicine through an IV tube that goes into one of your veins. You can control the amount of pain medicine that you get. The pain medicine is controlled by a pump. When you push the button that is hooked up to this pump, you receive a specific amount of pain medicine. This button should be pushed only by you or by someone who is specifically assigned by you to do so. It is set up to keep you from accidentally giving yourself too much pain medicine. You will be able to start using your pain pump in the recovery room after your surgery. This method can be helpful for most types of surgery.  · Tell your health care provider:    If you are having too much pain.    If you are feeling too sleepy or nauseous.  Continuous Epidural Pain Control  · A thin, soft tube (catheter) is put into your back, outside the outer layer of your spinal cord. Pain medicine flows through the catheter to lessen pain in areas of your body that are below the level of catheter placement. Continuous epidural pain control may work best for you   if you are having surgery on your abdomen, hip area, or legs. The epidural catheter is usually put into your back shortly before surgery. It is left in until you can eat, take medicine by mouth, pass urine, and have a bowel movement.  · Giving pain medicine through the epidural catheter may help you to heal more quickly because you can do these things sooner:    Regain normal bowel and bladder function.    Return to eating.    Get up and walk.  Medicine That  Numbs the Area (Local Anesthetic)  You may be given pain medicine:  · As an injection near the area of the pain (local infiltration).  · As an injection near the nerve that controls the sensation to a specific part of your body (peripheral nerve block).  · In your spine to block pain (spinal block).  · Through a local anesthetic reservoir pump. If your surgeon or anesthesiologist selects this option as a part of your pain control, one or more thin, soft tubes will be inserted into your incision site(s) at the end of surgery. These tubes will be connected to a device that is filled with a non-narcotic pain medicine. This medicine gradually empties into your incision site over the next several days. Usually, after all of the medicine is used, your health care provider will remove the tubes and throw away the device.  Opioids  · Moderate to moderately severe acute pain after surgery may respond to opioids. Opioids are narcotic pain medicine. Opioids are often combined with non-narcotic medicines to improve pain relief, lower the risk of side effects, and reduce the chance of addiction.  · If you follow your health care provider's directions about taking opioids and you do not have a history of substance abuse, your risk of becoming addicted is very small. To prevent addiction, opioids are given for short periods of time in careful doses.  Other Methods of Pain Control  · Steroids.  · Physical therapy.  · Heat and cold therapy.  · Compression, such as wrapping an elastic bandage around the area of the pain.  · Massage.     This information is not intended to replace advice given to you by your health care provider. Make sure you discuss any questions you have with your health care provider.     Document Released: 06/18/2002 Document Revised: 04/18/2014 Document Reviewed: 06/22/2010  Elsevier Interactive Patient Education ©2016 [employer]

## 2015-10-07 NOTE — ED Notes (Signed)
Pt alert and oriented X4, active, cooperative, pt in NAD. RR even and unlabored, color WNL.  Pt informed to return if any life threatening symptoms occur.   
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
## 2015-10-07 NOTE — ED Notes (Signed)
MD at bedside. 

## 2016-01-05 ENCOUNTER — Encounter: Payer: Self-pay | Admitting: Emergency Medicine

## 2016-01-05 ENCOUNTER — Emergency Department: Payer: Self-pay

## 2016-01-05 ENCOUNTER — Emergency Department
Admission: EM | Admit: 2016-01-05 | Discharge: 2016-01-06 | Disposition: A | Discharge status: Home / Self Care | Payer: Self-pay | Attending: Emergency Medicine | Admitting: Emergency Medicine

## 2016-01-05 DIAGNOSIS — M25552 Pain in left hip: Secondary | ICD-10-CM | POA: Insufficient documentation

## 2016-01-05 DIAGNOSIS — Y9389 Activity, other specified: Secondary | ICD-10-CM | POA: Insufficient documentation

## 2016-01-05 DIAGNOSIS — Y999 Unspecified external cause status: Secondary | ICD-10-CM | POA: Insufficient documentation

## 2016-01-05 DIAGNOSIS — F17210 Nicotine dependence, cigarettes, uncomplicated: Secondary | ICD-10-CM | POA: Insufficient documentation

## 2016-01-05 DIAGNOSIS — Y929 Unspecified place or not applicable: Secondary | ICD-10-CM | POA: Insufficient documentation

## 2016-01-05 LAB — POCT PREGNANCY, URINE: Preg Test, Ur: NEGATIVE

## 2016-01-05 MED ORDER — KETOROLAC TROMETHAMINE 30 MG/ML IJ SOLN
30.0000 mg | Freq: Once | INTRAMUSCULAR | Status: AC
  Administered 2016-01-05: 30 mg via INTRAVENOUS

## 2016-01-05 MED ORDER — KETOROLAC TROMETHAMINE 30 MG/ML IJ SOLN
INTRAMUSCULAR | Status: AC
  Administered 2016-01-05: 30 mg via INTRAVENOUS
  Filled 2016-01-05: qty 1
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
## 2016-01-05 NOTE — ED Notes (Signed)
Pt assisted to toilet without difficulty.
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
## 2016-01-06 MED ORDER — MORPHINE SULFATE (PF) 2 MG/ML IV SOLN
INTRAVENOUS | Status: AC
  Administered 2016-01-06: 2 mg via INTRAVENOUS
  Filled 2016-01-06: qty 1

## 2016-01-06 MED ORDER — MORPHINE SULFATE (PF) 2 MG/ML IV SOLN
2.0000 mg | Freq: Once | INTRAVENOUS | Status: AC
  Administered 2016-01-06: 2 mg via INTRAVENOUS

## 2016-01-06 MED ORDER — ACETAMINOPHEN 325 MG PO TABS
650.0000 mg | ORAL_TABLET | Freq: Once | ORAL | Status: AC
  Administered 2016-01-06: 650 mg via ORAL
  Filled 2016-01-06: qty 2

## 2016-01-06 NOTE — ED Notes (Signed)
Pt found outside entrance to ED. Pt made aware of reason and need to be in her room and that she was not up for discharge. Pt refused to come into room. Pt states she was going to wait for her ride there. Asked pt to come back inside and to her room and that first nurse would call me as soon as her ride came. Pt refused. Pt's gait steady and pt was alert and oriented x 4.

## 2016-01-06 NOTE — ED Notes (Signed)
Entered pt's room to give pt medication pt was up at the door. Pt stated I am ready to leave. Pt made a phone call previous and stated that friend was coming to get her. Informed pt it was safer for her to sit in the bed until her friend arrived to get her.

## 2016-01-06 NOTE — ED Notes (Signed)
Called by charge nurse that pt was ambulating down the hallway to the lobby. Informed charge nurse I was with another patient and Morrie Sheldonshley was not up for discharge. Charge nurse called nurse to send patient back to room.

## 2016-01-06 NOTE — ED Notes (Signed)
Pt given phone to find ride upon discharge
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
## 2016-03-01 ENCOUNTER — Emergency Department
Admission: EM | Admit: 2016-03-01 | Discharge: 2016-03-01 | Disposition: A | Discharge status: Home / Self Care | Payer: Medicaid Other | Attending: Emergency Medicine | Admitting: Emergency Medicine

## 2016-03-01 ENCOUNTER — Encounter: Payer: Self-pay | Admitting: Emergency Medicine

## 2016-03-01 DIAGNOSIS — Z79899 Other long term (current) drug therapy: Secondary | ICD-10-CM | POA: Insufficient documentation

## 2016-03-01 DIAGNOSIS — F17210 Nicotine dependence, cigarettes, uncomplicated: Secondary | ICD-10-CM | POA: Insufficient documentation

## 2016-03-01 DIAGNOSIS — N390 Urinary tract infection, site not specified: Secondary | ICD-10-CM

## 2016-03-01 LAB — POCT PREGNANCY, URINE: PREG TEST UR: NEGATIVE

## 2016-03-01 LAB — URINALYSIS COMPLETE WITH MICROSCOPIC (ARMC ONLY)
Bilirubin Urine: NEGATIVE
GLUCOSE, UA: NEGATIVE mg/dL
KETONES UR: NEGATIVE mg/dL
Nitrite: POSITIVE — AB
Protein, ur: NEGATIVE mg/dL
Specific Gravity, Urine: 1.018 (ref 1.005–1.030)
pH: 6 (ref 5.0–8.0)

## 2016-03-01 MED ORDER — SULFAMETHOXAZOLE-TRIMETHOPRIM 800-160 MG PO TABS: 1.0000 | ORAL_TABLET | Freq: Two times a day (BID) | ORAL | 0 refills | Status: DC

## 2016-03-01 NOTE — Discharge Instructions (Signed)
[facility] if any continued problems. Begin taking antibiotics twice a day for 10 days. Increase fluids. Take Tylenol or ibuprofen as needed for pain.

## 2016-03-01 NOTE — ED Provider Notes (Signed)
[facility] Emergency Department Provider Note  ____________________________________________   First MD Initiated Contact with Patient 03/01/16 1426     (approximate)  I have reviewed the triage vital signs and the nursing notes.   HISTORY  Chief Complaint Urinary Tract Infection and Exposure to STD   HPI Teresa Lowe is a 25 y.o. female is here with complaint of urinary tract infection. Patient states that her urine has been very dark and has bad odor. Patient states that she has a history of urinary tract infections with the last one being last year. She denies any vaginal discharge. She also complains of a swollen area behind both ears that she also wants to have checked. She denies any nausea, vomiting, diarrhea. There is been no fever or chills.   Past Medical History:  Diagnosis Date  . Gallstones   . Kidney disease   . Renal insufficiency     There are no active problems to display for this patient.   Past Surgical History:  Procedure Laterality Date  . CESAREAN SECTION    . FRACTURE SURGERY     left hip    Prior to Admission medications   Medication Sig Start Date End Date Taking? Authorizing Provider  buprenorphine-naloxone (SUBOXONE) 8-2 MG SUBL SL tablet Place 1 tablet under the tongue daily.   Yes Historical Provider, MD  sulfamethoxazole-trimethoprim (BACTRIM DS,SEPTRA DS) 800-160 MG tablet Take 1 tablet by mouth 2 (two) times daily. 03/01/16   Tommi Rumpshonda L Sherrelle Prochazka, PA-C    Allergies Patient has no known allergies.  No family history on file.  Social History Social History  Substance Use Topics  . Smoking status: Current Every Day Smoker    Packs/day: 1.00    Types: Cigarettes  . Smokeless tobacco: Never Used  . Alcohol use Yes     Comment: occasionally    Review of Systems Constitutional: No fever/chills Cardiovascular: Denies chest pain. Respiratory: Denies shortness of breath. Gastrointestinal:  No nausea, no  vomiting.  Genitourinary:Positive urinary symptoms. Denies vaginal discharge. Musculoskeletal: Negative for back pain. Skin: Negative for rash. Neurological: Negative for headaches, focal weakness or numbness.  10-point ROS otherwise negative.  ____________________________________________   PHYSICAL EXAM:  VITAL SIGNS: ED Triage Vitals  Enc Vitals Group     BP 03/01/16 1349 (!) 127/94     Pulse Rate 03/01/16 1349 83     Resp 03/01/16 1349 18     Temp 03/01/16 1349 97.7 F (36.5 C)     Temp Source 03/01/16 1349 Oral     SpO2 03/01/16 1349 100 %     Weight 03/01/16 1350 150 lb (68 kg)     Height 03/01/16 1350 5\' 4"  (1.626 m)     Head Circumference --      Peak Flow --      Pain Score --      Pain Loc --      Pain Edu? --      Excl. in GC? --     Constitutional: Alert and oriented. Well appearing and in no acute distress. Eyes: Conjunctivae are normal. PERRL. EOMI. Head: Atraumatic. Nose: No congestion/rhinnorhea.   EACs and TMs clear bilaterally. Neck: No stridor.   Minimal bilateral lymphadenopathy present postauricular. Nontender. Cardiovascular: Normal rate, regular rhythm. Grossly normal heart sounds.  Good peripheral circulation. Respiratory: Normal respiratory effort.  No retractions. Lungs CTAB. Gastrointestinal: Soft and nontender. No distention. No CVA tenderness. Musculoskeletal: No lower extremity tenderness nor edema.  No joint effusions. Neurologic:  Normal speech and language. No gross focal neurologic deficits are appreciated. No gait instability. Skin:  Skin is warm, dry and intact. No rash noted. No areas of infection or abrasions seen on the scalp to account for the adenopathy postauricular. Psychiatric: Mood and affect are normal. Speech and behavior are normal.  ____________________________________________   LABS (all labs ordered are listed, but only abnormal results are displayed)  Labs Reviewed  URINALYSIS COMPLETEWITH MICROSCOPIC ([facility] ONLY)  - Abnormal; Notable for the following:       Result Value   Color, Urine AMBER (*)    APPearance CLOUDY (*)    Hgb urine dipstick 3+ (*)    Nitrite POSITIVE (*)    Leukocytes, UA TRACE (*)    Bacteria, UA MANY (*)    Squamous Epithelial / LPF 0-5 (*)    All other components within normal limits  POC URINE PREG, ED  POCT PREGNANCY, URINE     PROCEDURES  Procedure(s) performed: None  Procedures  Critical Care performed: No  ____________________________________________   INITIAL IMPRESSION / ASSESSMENT AND PLAN / ED COURSE  Pertinent labs & imaging results that were available during my care of the patient were reviewed by me and considered in my medical decision making (see chart for details).    Clinical Course    Patient was started on Septra DS twice a day for 10 days. She is to increase fluids. She'll follow-up with the health department or [facility] if any continued problems.   ____________________________________________   FINAL CLINICAL IMPRESSION(S) / ED DIAGNOSES  Final diagnoses:  Acute urinary tract infection      NEW MEDICATIONS STARTED DURING THIS VISIT:  Discharge Medication List as of 03/01/2016  3:00 PM       Note:  This document was prepared using Dragon voice recognition software and may include unintentional dictation errors.    Tommi RumpsRhonda L Amiayah Giebel, PA-C 03/01/16 1536    Jennye MoccasinBrian S Quigley, MD 03/01/16 1540

## 2016-03-01 NOTE — ED Triage Notes (Signed)
Pt wants to be checked for possible UTI and possible std. States her urine is very dark and has an odor.

## 2016-03-01 NOTE — ED Notes (Signed)
See triage note  "I think I have a UTI  "  Positive dysuria and freq  Also noticed her urine being dark for the past few days   Then noticed a swollen area behind left ear today

## 2016-03-21 ENCOUNTER — Encounter: Payer: Self-pay | Admitting: Emergency Medicine

## 2016-03-21 ENCOUNTER — Emergency Department
Admission: EM | Admit: 2016-03-21 | Discharge: 2016-03-21 | Disposition: A | Discharge status: Home / Self Care | Payer: Self-pay | Attending: Emergency Medicine | Admitting: Emergency Medicine

## 2016-03-21 DIAGNOSIS — Y939 Activity, unspecified: Secondary | ICD-10-CM | POA: Insufficient documentation

## 2016-03-21 DIAGNOSIS — Y929 Unspecified place or not applicable: Secondary | ICD-10-CM | POA: Insufficient documentation

## 2016-03-21 DIAGNOSIS — Y999 Unspecified external cause status: Secondary | ICD-10-CM | POA: Insufficient documentation

## 2016-03-21 DIAGNOSIS — F17210 Nicotine dependence, cigarettes, uncomplicated: Secondary | ICD-10-CM | POA: Insufficient documentation

## 2016-03-21 DIAGNOSIS — W260XXA Contact with knife, initial encounter: Secondary | ICD-10-CM | POA: Insufficient documentation

## 2016-03-21 DIAGNOSIS — S61210A Laceration without foreign body of right index finger without damage to nail, initial encounter: Secondary | ICD-10-CM | POA: Insufficient documentation

## 2016-03-21 MED ORDER — LIDOCAINE HCL (PF) 1 % IJ SOLN
5.0000 mL | Freq: Once | INTRAMUSCULAR | Status: DC
  Filled 2016-03-21: qty 5

## 2016-03-21 NOTE — Discharge Instructions (Signed)
Keep the wound clean, dry, and covered. Wash only with soap & water. Follow-up with a local urgent care center ([facility], [facility], etc.) for suture removal in 10-12 days.

## 2016-03-21 NOTE — ED Triage Notes (Signed)
States she cut left index finger with knife

## 2016-03-21 NOTE — ED Provider Notes (Signed)
[facility] Emergency Department Provider Note ____________________________________________  Time seen: 1521  I have reviewed the triage vital signs and the nursing notes.  HISTORY  Chief Complaint  Laceration  HPI Teresa Lowe is a 25 y.o. female presents to the ED for treatment of an accidental laceration to the right index finger. She describes cutting a package of roast beef, when the knife slipped, cutting her across the palmer aspect of the right index. She reports a current tetanus status.   Past Medical History:  Diagnosis Date  . Gallstones   . Kidney disease   . Renal insufficiency     There are no active problems to display for this patient.   Past Surgical History:  Procedure Laterality Date  . CESAREAN SECTION    . FRACTURE SURGERY     left hip    Prior to Admission medications   Medication Sig Start Date End Date Taking? Authorizing Provider  buprenorphine-naloxone (SUBOXONE) 8-2 MG SUBL SL tablet Place 1 tablet under the tongue daily.    Historical Provider, MD  sulfamethoxazole-trimethoprim (BACTRIM DS,SEPTRA DS) 800-160 MG tablet Take 1 tablet by mouth 2 (two) times daily. 03/01/16   Tommi Rumpshonda L Summers, PA-C    Allergies Patient has no known allergies.  No family history on file.  Social History Social History  Substance Use Topics  . Smoking status: Current Every Day Smoker    Packs/day: 1.00    Types: Cigarettes  . Smokeless tobacco: Never Used  . Alcohol use Yes     Comment: occasionally    Review of Systems  Constitutional: Negative for fever. Musculoskeletal: Negative for back pain. Skin: Negative for rash. Right finger lac as above Neurological: Negative for headaches, focal weakness or numbness. ____________________________________________  PHYSICAL EXAM:  VITAL SIGNS: ED Triage Vitals  Enc Vitals Group     BP 03/21/16 1524 130/78     Pulse Rate 03/21/16 1524 88     Resp 03/21/16 1524 20     Temp  03/21/16 1524 97 F (36.1 C)     Temp Source 03/21/16 1524 Oral     SpO2 03/21/16 1524 97 %     Weight 03/21/16 1517 150 lb (68 kg)     Height 03/21/16 1517 5\' 4"  (1.626 m)     Head Circumference --      Peak Flow --      Pain Score 03/21/16 1517 7     Pain Loc --      Pain Edu? --      Excl. in GC? --     Constitutional: Alert and oriented. Well appearing and in no distress. Head: Normocephalic and atraumatic. Cardiovascular: Normal distal pulses. Respiratory: Normal respiratory effort.  Musculoskeletal: Normal composite fist. Normal right index finger flex/ext ROM. Nontender with normal range of motion in all extremities.  Neurologic:  Normal gross sensation. Normal intrinsic/opposition testing. Normal speech and language. No gross focal neurologic deficits are appreciated. Skin:  Skin is warm, dry and intact. No rash noted. Right index with a linear lac across the volar aspect of the middle phalanx.  ____________________________________________  PROCEDURES  LACERATION REPAIR Performed by: Lissa HoardMenshew, [place] Hagger V Bacon Authorized by: Lissa HoardMenshew, Milessa Hogan V Bacon Consent: Verbal consent obtained. Risks and benefits: risks, benefits and alternatives were discussed Consent given by: patient Patient identity confirmed: provided demographic data Prepped and Draped in normal sterile fashion Wound explored  Laceration Location: right index finger  Laceration Length: 1.2 cm  No Foreign Bodies seen or palpated  Anesthesia: transthecal infiltration  Local anesthetic: lidocaine 1% w/o epinephrine  Anesthetic total: 2 ml  Irrigation method: syringe Amount of cleaning: standard  Skin closure: 5-0 nylon  Number of sutures: 3  Technique: interrupted  Patient tolerance: Patient tolerated the procedure well with no immediate complications. ____________________________________________  INITIAL IMPRESSION / ASSESSMENT AND PLAN / ED COURSE  Patient with a finger laceration s/p suture  repair. She is given wound care instructions. She will follow-up with a local urgent care for suture removal in 10-12 days.   Clinical Course    ____________________________________________  FINAL CLINICAL IMPRESSION(S) / ED DIAGNOSES  Final diagnoses:  Laceration of right index finger without foreign body without damage to nail, initial encounter     Lissa HoardJenise V Bacon Leathia Farnell, PA-C 03/21/16 1619    Nita Sicklearolina Veronese, MD 03/23/16 1039

## 2016-05-13 ENCOUNTER — Ambulatory Visit (INDEPENDENT_AMBULATORY_CARE_PROVIDER_SITE_OTHER): Payer: Self-pay | Admitting: Certified Nurse Midwife

## 2016-05-13 ENCOUNTER — Other Ambulatory Visit: Payer: Self-pay | Admitting: Certified Nurse Midwife

## 2016-05-13 ENCOUNTER — Other Ambulatory Visit (INDEPENDENT_AMBULATORY_CARE_PROVIDER_SITE_OTHER): Payer: Self-pay

## 2016-05-13 ENCOUNTER — Encounter: Payer: Self-pay | Admitting: Certified Nurse Midwife

## 2016-05-13 VITALS — BP 105/63 | HR 103 | Ht 64.0 in | Wt 178.3 lb

## 2016-05-13 DIAGNOSIS — Z87898 Personal history of other specified conditions: Secondary | ICD-10-CM

## 2016-05-13 DIAGNOSIS — Z30431 Encounter for routine checking of intrauterine contraceptive device: Secondary | ICD-10-CM

## 2016-05-13 DIAGNOSIS — F1911 Other psychoactive substance abuse, in remission: Secondary | ICD-10-CM

## 2016-05-13 DIAGNOSIS — Z789 Other specified health status: Secondary | ICD-10-CM

## 2016-05-13 DIAGNOSIS — N644 Mastodynia: Secondary | ICD-10-CM

## 2016-05-13 DIAGNOSIS — R102 Pelvic and perineal pain: Secondary | ICD-10-CM

## 2016-05-13 DIAGNOSIS — Z124 Encounter for screening for malignant neoplasm of cervix: Secondary | ICD-10-CM

## 2016-05-13 DIAGNOSIS — R319 Hematuria, unspecified: Secondary | ICD-10-CM

## 2016-05-13 DIAGNOSIS — N926 Irregular menstruation, unspecified: Secondary | ICD-10-CM

## 2016-05-13 LAB — POCT URINALYSIS DIPSTICK
Bilirubin, UA: NEGATIVE
Glucose, UA: NEGATIVE
KETONES UA: NEGATIVE
Leukocytes, UA: NEGATIVE
NITRITE UA: POSITIVE
PH UA: 7
Spec Grav, UA: 1.015
Urobilinogen, UA: NEGATIVE

## 2016-05-13 LAB — POCT URINE PREGNANCY: Preg Test, Ur: NEGATIVE

## 2016-05-13 MED ORDER — NITROFURANTOIN MONOHYD MACRO 100 MG PO CAPS: 100.0000 mg | ORAL_CAPSULE | Freq: Two times a day (BID) | ORAL | 0 refills | Status: DC

## 2016-05-13 NOTE — Progress Notes (Signed)
ANNUAL PREVENTATIVE CARE GYN  ENCOUNTER NOTE  Subjective:       Teresa Lowe is a 26 y.o. G82P1001 female here for a routine annual gynecologic exam.  Current complaints: irregular menses, breast pain, and pelvic pain.   She states that her periods are irregular, but is unable to provide further details.   She reports intermittent lower abdominal pain for the last few months not associated with her menstrual cycle.   Maryjo questions her fertility and contraception. When her baby was born via c-section in 2009, she states that they offered her an IUD immediately. Deseree is unable to remember if she consented to IUD placement. She believes she does have an IUD since she and her boyfriend have been having unprotected sex for the last six or seven years without conceiving.   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   Family history: breast cancer (grandmother) and prostate cancer (grandfather).    Denies difficulty breathing or respiratory distress, visual changes, chest pain, dysuria, and leg pain or swelling.    Gynecologic History  Patient's last menstrual period was 05/09/2016 (exact date).   Contraception: none and pt may have IUD, unsure   Last Pap: last year. Results were: normal at [facility], unable to find results in Care Everywhere.   Obstetric History OB History  Gravida Para Term Preterm AB Living  1 1 1  0 0 1  SAB TAB Ectopic Multiple Live Births  0 0 0   1    # Outcome Date GA Lbr Len/2nd Weight Sex Delivery Anes PTL Lv  1 Term 2009    M CS-LTranv   LIV      Past Medical History:  Diagnosis Date  . Drug abuse   . Gallstones   . Kidney disease   . Renal insufficiency     Past Surgical History:  Procedure Laterality Date  . CESAREAN SECTION    . FRACTURE SURGERY     left hip    No current outpatient prescriptions on file prior to visit.   No current facility-administered medications  on file prior to visit.     No Known Allergies  Social History   Social History  . Marital status: Single    Spouse name: N/A  . Number of children: N/A  . Years of education: N/A   Occupational History  . Not on file.   Social History Main Topics  . Smoking status: Current Every Day Smoker    Packs/day: 1.00    Types: Cigarettes  . Smokeless tobacco: Never Used  . Alcohol use No  . Drug use: No  . Sexual activity: Yes    Birth control/ protection: None   Other Topics Concern  . Not on file   Social History Narrative   ** Merged History Encounter **        Family History  Problem Relation Age of Onset  . Breast cancer Paternal Grandmother   . Diabetes Paternal Grandmother   . Ovarian cancer Neg Hx   . Colon cancer Neg Hx     The following portions of the patient's history were reviewed and updated as appropriate: allergies, current medications, past family history, past medical history, past social history, past surgical history and problem list.  Review of Systems  ROS negative except for as noted above. Information obtained from patient.    Objective:   BP 105/63   Pulse (!) 103  Ht 5\' 4"  (1.626 m)   Wt 178 lb 4.8 oz (80.9 kg)   LMP 05/09/2016 (Exact Date)   BMI 30.61 kg/m    CONSTITUTIONAL: Well-developed, well-nourished female disheveled appearance  NEUROLGIC: Alert and oriented to person, place, and time. Normal muscle tone coordination.   HENT:  Normocephalic, atraumatic, External right and left ear normal.   EYES: Conjunctivae and EOM are normal.   NECK: Normal range of motion, supple, no masses.  Normal thyroid.   SKIN: Skin is warm and dry. No rash noted. Not diaphoretic. No erythema. No pallor.  CARDIOVASCULAR: Normal heart rate noted, regular rhythm, no murmur.  RESPIRATORY: Clear to auscultation bilaterally.   BREASTS: Symmetric in size. No skin changes, nipple drainage, or lymphadenopathy. Pea sized mobile mass left side one  o'clock,   ABDOMEN: Soft, normal bowel sounds, no distention noted.  No tenderness, rebound or guarding.   PELVIC:  External Genitalia: Normal  Vagina: Normal  Cervix: Normal, yellow discharge present.  Uterus: Normal  Adnexa: Normal  MUSCULOSKELETAL: Normal range of motion. No tenderness.  No cyanosis, clubbing, or edema.  2+ distal pulses.  LYMPHATIC: No Axillary, Supraclavicular, or Inguinal Adenopathy.  Assessment:   Annual gynecologic examination 26 y.o. Contraception: none   Obesity 1   Problem List Items Addressed This Visit    None    Visit Diagnoses    Irregular menses    -  Primary   Relevant Orders   POCT urine pregnancy (Completed)   History of drug abuse       Relevant Orders   Monitor Drug Profile 14(MW)   Pelvic pain       Relevant Orders   POCT urinalysis dipstick (Completed)   Urine culture   NuSwab Vaginitis Plus (VG+)   Hematuria, unspecified type       Relevant Orders   Urine culture   Screening for cervical cancer       Relevant Orders   Pap IG w/ reflex to HPV when ASC-U      Plan:  Pap: Pap, Reflex if ASCUS   NuSwab, urine dip, urine pregnancy, and urine drug collected  Advised pt to monitor breast mass for changes over the next three (3) months, decrease caffeine, increase water, and take vitamin E supplement  Labs: None today, will follow up after US reviewed   Routine preventative health maintenance measures emphasized: Exercise/Diet/Weight control, Tobacco Warnings and Alcohol/Substance use risks  US for pelvic pain and possible IUD  Return to Clinic - 3 months or sooner if needed   Gunnar BullaJenkins Michelle Lawhorn, CNM

## 2016-05-16 LAB — URINE CULTURE

## 2016-05-16 NOTE — Progress Notes (Signed)
Would you please call the patient with results. If she would like yearly lab work, then we can place the order and she can come in at anytime. Thanks, JML

## 2016-05-18 LAB — NUSWAB VAGINITIS PLUS (VG+)
ATOPOBIUM VAGINAE: HIGH {score} — AB
BVAB 2: HIGH Score — AB
CANDIDA ALBICANS, NAA: POSITIVE — AB
CANDIDA GLABRATA, NAA: NEGATIVE
CHLAMYDIA TRACHOMATIS, NAA: NEGATIVE
Megasphaera 1: HIGH Score — AB
NEISSERIA GONORRHOEAE, NAA: NEGATIVE
TRICH VAG BY NAA: NEGATIVE

## 2016-05-19 ENCOUNTER — Other Ambulatory Visit: Payer: Self-pay

## 2016-05-19 ENCOUNTER — Telehealth: Payer: Self-pay

## 2016-05-19 DIAGNOSIS — E663 Overweight: Secondary | ICD-10-CM

## 2016-05-19 DIAGNOSIS — N926 Irregular menstruation, unspecified: Secondary | ICD-10-CM

## 2016-05-19 MED ORDER — METRONIDAZOLE 500 MG PO TABS: 500.0000 mg | ORAL_TABLET | Freq: Two times a day (BID) | ORAL | 0 refills | Status: DC

## 2016-05-19 MED ORDER — TERCONAZOLE 0.4 % VA CREA: 1.0000 | TOPICAL_CREAM | Freq: Every day | VAGINAL | 0 refills | Status: DC

## 2016-05-19 NOTE — Progress Notes (Signed)
Would also treat her with Flagyl and Terazol, please. Thanks, JML

## 2016-05-19 NOTE — Telephone Encounter (Signed)
-----   Message from Gunnar BullaJenkins Michelle Lawhorn, CNM sent at 05/19/2016 10:48 AM EST ----- Would also treat her with Flagyl and Terazol, please. Thanks, JML

## 2016-05-19 NOTE — Telephone Encounter (Signed)
Pt aware of lab results. Meds erx. Pt wanted an appt to discuss getting pregnant. Made for 2/15 at 1:45.

## 2016-05-20 ENCOUNTER — Encounter: Payer: Self-pay | Admitting: Emergency Medicine

## 2016-05-20 ENCOUNTER — Other Ambulatory Visit: Payer: Self-pay

## 2016-05-20 ENCOUNTER — Emergency Department
Admission: EM | Admit: 2016-05-20 | Discharge: 2016-05-20 | Disposition: A | Discharge status: Home / Self Care | Payer: Self-pay | Attending: Emergency Medicine | Admitting: Emergency Medicine

## 2016-05-20 ENCOUNTER — Other Ambulatory Visit: Payer: Self-pay | Admitting: Obstetrics and Gynecology

## 2016-05-20 DIAGNOSIS — N3000 Acute cystitis without hematuria: Secondary | ICD-10-CM | POA: Insufficient documentation

## 2016-05-20 DIAGNOSIS — E663 Overweight: Secondary | ICD-10-CM

## 2016-05-20 DIAGNOSIS — F17210 Nicotine dependence, cigarettes, uncomplicated: Secondary | ICD-10-CM | POA: Insufficient documentation

## 2016-05-20 DIAGNOSIS — N926 Irregular menstruation, unspecified: Secondary | ICD-10-CM

## 2016-05-20 LAB — PAP IG W/ RFLX HPV ASCU: PAP SMEAR COMMENT: 0

## 2016-05-20 MED ORDER — NITROFURANTOIN MONOHYD MACRO 100 MG PO CAPS: 100.0000 mg | ORAL_CAPSULE | Freq: Two times a day (BID) | ORAL | 0 refills | Status: DC

## 2016-05-20 NOTE — ED Triage Notes (Signed)
Pt c/o white vaginal discharge and some itching. Also reports her "pee is a golden color". No dysuria or increased frequency. Denies fevers.

## 2016-05-20 NOTE — ED Notes (Signed)
AAOx3.  Skin warm and dry.  NAD 

## 2016-05-20 NOTE — ED Provider Notes (Signed)
[facility] Emergency Department Provider Note  ____________________________________________  Time seen: Approximately 4:39 PM  I have reviewed the triage vital signs and the nursing notes.   HISTORY  Chief Complaint vaginal discharge/itching    HPI Teresa Lowe is a 26 y.o. female that presents to the emergency department after failing to take nitrofurantoin for urinary tract infection. Patient states that she was seen at [facility] care 1 week ago for yeast infection and urinary tract infection. Patient just started taking her medication for the yeast infection and lost the medication for the urinary tract infection. Patient denies any other symptoms. No fever, chills, cough, shortness of breath, chest pain, nausea, vomiting, abdominal pain, back pain.   Past Medical History:  Diagnosis Date  . Drug abuse   . Gallstones   . Kidney disease   . Renal insufficiency     There are no active problems to display for this patient.   Past Surgical History:  Procedure Laterality Date  . CESAREAN SECTION    . FRACTURE SURGERY     left hip    Prior to Admission medications   Medication Sig Start Date End Date Taking? Authorizing Provider  metroNIDAZOLE (FLAGYL) 500 MG tablet Take 1 tablet (500 mg total) by mouth 2 (two) times daily. 05/19/16   Prentice Docker Defrancesco, MD  nitrofurantoin, macrocrystal-monohydrate, (MACROBID) 100 MG capsule Take 1 capsule (100 mg total) by mouth 2 (two) times daily. 05/20/16 05/27/16  Enid Derry, PA-C  terconazole (TERAZOL 7) 0.4 % vaginal cream Place 1 applicator vaginally at bedtime. For seven nights. 05/19/16   Prentice Docker Defrancesco, MD    Allergies Amoxicillin  Family History  Problem Relation Age of Onset  . Breast cancer Paternal Grandmother   . Diabetes Paternal Grandmother   . Ovarian cancer Neg Hx   . Colon cancer Neg Hx     Social History Social History  Substance Use Topics  . Smoking status: Current Every Day  Smoker    Packs/day: 1.00    Types: Cigarettes  . Smokeless tobacco: Never Used  . Alcohol use No     Review of Systems  Constitutional: No fever/chills ENT: No upper respiratory complaints. Cardiovascular: No chest pain. Respiratory: No cough. No SOB. Gastrointestinal: No abdominal pain.  No nausea, no vomiting.  Musculoskeletal: Negative for musculoskeletal pain. Skin: Negative for rash, abrasions, lacerations, ecchymosis. Neurological: Negative for headaches, numbness or tingling   ____________________________________________   PHYSICAL EXAM:  VITAL SIGNS: ED Triage Vitals  Enc Vitals Group     BP 05/20/16 1605 112/73     Pulse Rate 05/20/16 1605 73     Resp 05/20/16 1605 18     Temp 05/20/16 1605 97.7 F (36.5 C)     Temp Source 05/20/16 1605 Oral     SpO2 05/20/16 1605 100 %     Weight 05/20/16 1603 178 lb (80.7 kg)     Height 05/20/16 1603 5\' 4"  (1.626 m)     Head Circumference --      Peak Flow --      Pain Score --      Pain Loc --      Pain Edu? --      Excl. in GC? --      Constitutional: Alert. Well appearing and in no acute distress. Eyes: Conjunctivae are normal. PERRL. EOMI. Head: Atraumatic. ENT:      Ears:      Nose: No congestion/rhinnorhea.      Mouth/Throat: Mucous membranes are  moist.  Neck: No stridor.   Cardiovascular: Normal rate, regular rhythm.  Good peripheral circulation. Respiratory: Normal respiratory effort without tachypnea or retractions. Lungs CTAB. Good air entry to the bases with no decreased or absent breath sounds. Gastrointestinal: Bowel sounds 4 quadrants. Soft and nontender to palpation. No guarding or rigidity. No palpable masses. No distention. No CVA tenderness. Musculoskeletal: Full range of motion to all extremities. No gross deformities appreciated. Neurologic:  Normal speech and language. No gross focal neurologic deficits are appreciated.  Skin:  Skin is warm, dry and intact. No rash  noted.   ____________________________________________   LABS (all labs ordered are listed, but only abnormal results are displayed)  Labs Reviewed - No data to display ____________________________________________  EKG   ____________________________________________  RADIOLOGY  No results found.  ____________________________________________    PROCEDURES  Procedure(s) performed:    Procedures    Medications - No data to display   ____________________________________________   INITIAL IMPRESSION / ASSESSMENT AND PLAN / ED COURSE  Pertinent labs & imaging results that were available during my care of the patient were reviewed by me and considered in my medical decision making (see chart for details).  Review of the [place] CSRS was performed in accordance of the [facility] prior to dispensing any controlled drugs.     Patient's diagnosis is consistent with urinary tract infection. Vital signs are reassuring. Very suspicious for ingesting mind altering medications before coming to ED. I do not think urinalysis is indicated at this time since patient never took antibiotics for urinary tract infection. No systemic symptoms. Patient just started medication for yeast infection. Patient will be discharged home with prescriptions for Macrobid. Patient is to follow up with PCP as directed. Patient is given ED precautions to return to the ED for any worsening or new symptoms.     ____________________________________________  FINAL CLINICAL IMPRESSION(S) / ED DIAGNOSES  Final diagnoses:  Acute cystitis without hematuria      NEW MEDICATIONS STARTED DURING THIS VISIT:  Discharge Medication List as of 05/20/2016  4:39 PM          This chart was dictated using voice recognition software/Dragon. Despite best efforts to proofread, errors can occur which can change the meaning. Any change was purely unintentional.    Enid Derryshley Brynden Thune, PA-C 05/20/16 1850    Governor Rooksebecca Lord,  MD 05/20/16 2111

## 2016-05-20 NOTE — ED Notes (Signed)
See triage note  States she was seen yesterday and given rx for UTI and vaginosis   States she has the flagyl and vaginal cream  But lost the pills for uti

## 2016-05-21 LAB — COMPREHENSIVE METABOLIC PANEL
A/G RATIO: 1.5 (ref 1.2–2.2)
ALK PHOS: 90 IU/L (ref 39–117)
ALT: 23 IU/L (ref 0–32)
AST: 13 IU/L (ref 0–40)
Albumin: 4.3 g/dL (ref 3.5–5.5)
BUN/Creatinine Ratio: 22 (ref 9–23)
BUN: 11 mg/dL (ref 6–20)
Bilirubin Total: 0.3 mg/dL (ref 0.0–1.2)
CALCIUM: 9.2 mg/dL (ref 8.7–10.2)
CO2: 22 mmol/L (ref 18–29)
CREATININE: 0.49 mg/dL — AB (ref 0.57–1.00)
Chloride: 98 mmol/L (ref 96–106)
GFR calc Af Amer: 159 mL/min/{1.73_m2} (ref >59)
GFR, EST NON AFRICAN AMERICAN: 138 mL/min/{1.73_m2} (ref >59)
Globulin, Total: 2.8 g/dL (ref 1.5–4.5)
Glucose: 93 mg/dL (ref 65–99)
POTASSIUM: 4.3 mmol/L (ref 3.5–5.2)
Sodium: 138 mmol/L (ref 134–144)
Total Protein: 7.1 g/dL (ref 6.0–8.5)

## 2016-05-21 LAB — LIPID PANEL
Chol/HDL Ratio: 3.2 ratio units (ref 0.0–4.4)
Cholesterol, Total: 148 mg/dL (ref 100–199)
HDL: 46 mg/dL (ref >39)
LDL Calculated: 74 mg/dL (ref 0–99)
Triglycerides: 142 mg/dL (ref 0–149)
VLDL Cholesterol Cal: 28 mg/dL (ref 5–40)

## 2016-05-21 LAB — HEMOGLOBIN A1C
ESTIMATED AVERAGE GLUCOSE: 105 mg/dL
HEMOGLOBIN A1C: 5.3 % (ref 4.8–5.6)

## 2016-05-21 LAB — TSH: TSH: 1.93 u[IU]/mL (ref 0.450–4.500)

## 2016-05-21 LAB — VITAMIN D 25 HYDROXY (VIT D DEFICIENCY, FRACTURES): Vit D, 25-Hydroxy: 23.9 ng/mL — ABNORMAL LOW (ref 30.0–100.0)

## 2016-05-23 LAB — BUPRENORPHINE CONFIRM, URINE
BUPRENORPHINE CONFIRM: 126 ng/mL
Buprenorphine: POSITIVE — AB
Buprenorphine: POSITIVE — AB
NORBUPRENORPHINE: POSITIVE — AB
Norbuprenorphine Confirm: 30 ng/mL

## 2016-05-23 LAB — MONITOR DRUG PROFILE 14(MW)
AMPHETAMINE SCREEN URINE: NEGATIVE ng/mL
BARBITURATE SCREEN URINE: NEGATIVE ng/mL
BENZODIAZEPINE SCREEN, URINE: NEGATIVE ng/mL
CANNABINOIDS UR QL SCN: NEGATIVE ng/mL
Cocaine (Metab) Scrn, Ur: NEGATIVE ng/mL
Creatinine(Crt), U: 123.8 mg/dL (ref 20.0–300.0)
FENTANYL, URINE: NEGATIVE pg/mL
Meperidine Screen, Urine: NEGATIVE ng/mL
Methadone Screen, Urine: NEGATIVE ng/mL
OPIATE SCREEN URINE: NEGATIVE ng/mL
OXYCODONE+OXYMORPHONE UR QL SCN: NEGATIVE ng/mL
PH UR, DRUG SCRN: 7.3 (ref 4.5–8.9)
Phencyclidine Qn, Ur: NEGATIVE ng/mL
Propoxyphene Scrn, Ur: NEGATIVE ng/mL
SPECIFIC GRAVITY: 1.028
Tramadol Screen, Urine: NEGATIVE ng/mL

## 2016-05-26 ENCOUNTER — Ambulatory Visit (INDEPENDENT_AMBULATORY_CARE_PROVIDER_SITE_OTHER): Payer: Self-pay | Admitting: Certified Nurse Midwife

## 2016-05-26 ENCOUNTER — Encounter: Payer: Self-pay | Admitting: Certified Nurse Midwife

## 2016-05-26 VITALS — BP 113/82 | HR 82 | Ht 64.0 in | Wt 180.1 lb

## 2016-05-26 DIAGNOSIS — E559 Vitamin D deficiency, unspecified: Secondary | ICD-10-CM

## 2016-05-26 DIAGNOSIS — Z3169 Encounter for other general counseling and advice on procreation: Secondary | ICD-10-CM

## 2016-05-26 MED ORDER — VITAMIN D (ERGOCALCIFEROL) 1.25 MG (50000 UNIT) PO CAPS: 50000.0000 [IU] | ORAL_CAPSULE | ORAL | 0 refills | Status: DC

## 2016-05-26 NOTE — Progress Notes (Signed)
Patient ID: Teresa Lowe, female   DOB: Dec 09, 1990, 26 y.o.   MRN: [id number] Pt wants to discuss getting pregnant. LMP: 05/09/2016

## 2016-05-26 NOTE — Progress Notes (Signed)
OB/GYN CONFERENCE NOTE:  Subjective:       Teresa Lowe is a 26 y.o. G6P1001 female who presents for preconception counseling appointment and annual lab review.   Alys reports unprotected sex with multiple partners over the last eight (23) years without pregnancy. She and her current boyfriend have been together for two (2) years.   She smokes cigarettes and vapes. She is currently taking "Suboxone that was previously prescribed to her to help with her leg pain".   She is not taking a prenatal vitamin.   Her periods occur monthly, but she is not tracking them.   Denies difficulty breathing or respiratory distress, chest pain, abdominal pain, vaginal bleeding, and leg pain or swelling.    Gynecologic History  Patient's last menstrual period was 05/09/2016 (exact date).   Contraception: none  Obstetric History OB History  Gravida Para Term Preterm AB Living  1 1 1  0 0 1  SAB TAB Ectopic Multiple Live Births  0 0 0   1    # Outcome Date GA Lbr Len/2nd Weight Sex Delivery Anes PTL Lv  1 Term 2009    M CS-LTranv   LIV      Past Medical History:  Diagnosis Date  . Drug abuse   . Gallstones   . Kidney disease   . Renal insufficiency     Past Surgical History:  Procedure Laterality Date  . CESAREAN SECTION    . FRACTURE SURGERY     left hip    No current outpatient prescriptions on file prior to visit.   No current facility-administered medications on file prior to visit.     Allergies  Allergen Reactions  . Amoxicillin Rash    Social History   Social History  . Marital status: Single    Spouse name: N/A  . Number of children: N/A  . Years of education: N/A   Occupational History  . Not on file.   Social History Main Topics  . Smoking status: Current Every Day Smoker    Packs/day: 1.00    Types: Cigarettes  . Smokeless tobacco: Never Used  . Alcohol use No  . Drug use: No  . Sexual activity: Yes    Birth control/ protection: None   Other Topics  Concern  . Not on file   Social History Narrative   ** Merged History Encounter **        Family History  Problem Relation Age of Onset  . Breast cancer Paternal Grandmother   . Diabetes Paternal Grandmother   . Ovarian cancer Neg Hx   . Colon cancer Neg Hx     The following portions of the patient's history were reviewed and updated as appropriate: allergies, current medications, past family history, past medical history, past social history, past surgical history and problem list.  Review of Systems  Pertinent items are noted in HPI.   Objective:   BP 113/82   Pulse 82   Ht 5' 4"  (1.626 m)   Wt 180 lb 1.6 oz (81.7 kg)   LMP 05/09/2016 (Exact Date)   BMI 30.91 kg/m   Alert and oriented x 4  Recent Results (from the past 2160 hour(s))  Urinalysis complete, with microscopic ([facility] only)     Status: Abnormal   Collection Time: 03/01/16  2:11 PM  Result Value Ref Range   Color, Urine AMBER (A) YELLOW   APPearance CLOUDY (A) CLEAR   Glucose, UA NEGATIVE NEGATIVE mg/dL   Bilirubin Urine NEGATIVE NEGATIVE  Ketones, ur NEGATIVE NEGATIVE mg/dL   Specific Gravity, Urine 1.018 1.005 - 1.030   Hgb urine dipstick 3+ (A) NEGATIVE   pH 6.0 5.0 - 8.0   Protein, ur NEGATIVE NEGATIVE mg/dL   Nitrite POSITIVE (A) NEGATIVE   Leukocytes, UA TRACE (A) NEGATIVE   RBC / HPF 6-30 0 - 5 RBC/hpf   WBC, UA 6-30 0 - 5 WBC/hpf   Bacteria, UA MANY (A) NONE SEEN   Squamous Epithelial / LPF 0-5 (A) NONE SEEN   Mucous PRESENT    Amorphous Crystal PRESENT   Pregnancy, urine POC     Status: None   Collection Time: 03/01/16  2:19 PM  Result Value Ref Range   Preg Test, Ur NEGATIVE NEGATIVE    Comment:        THE SENSITIVITY OF THIS METHODOLOGY IS >24 mIU/mL   POCT urine pregnancy     Status: None   Collection Time: 05/13/16 12:22 PM  Result Value Ref Range   Preg Test, Ur Negative Negative  POCT urinalysis dipstick     Status: None   Collection Time: 05/13/16 12:47 PM  Result Value  Ref Range   Color, UA yellow    Clarity, UA clear    Glucose, UA neg    Bilirubin, UA neg    Ketones, UA neg    Spec Grav, UA 1.015    Blood, UA large    pH, UA 7.0    Protein, UA trace    Urobilinogen, UA negative    Nitrite, UA pos    Leukocytes, UA Negative Negative  Monitor Drug Profile 14(MW)     Status: None   Collection Time: 05/13/16  2:38 PM  Result Value Ref Range   AMPHETAMINE SCREEN URINE Negative Cutoff=1000 ng/mL   BARBITURATE SCREEN URINE Negative Cutoff=200 ng/mL   BENZODIAZEPINE SCREEN, URINE Negative Cutoff=200 ng/mL   CANNABINOIDS UR QL SCN Negative Cutoff=20 ng/mL   COCAINE(METAB.)SCREEN, URINE Negative Cutoff=300 ng/mL   OPIATE SCREEN URINE Negative Cutoff=300 ng/mL    Comment: Opiate test includes Codeine, Morphine, Hydromorphone, Hydrocodone.   OXYCODONE+OXYMORPHONE UR QL SCN Negative Cutoff=100 ng/mL    Comment: Test includes Oxycodone and Oxymorphone   PHENCYCLIDINE QUANTITATIVE URINE Negative Cutoff=25 ng/mL   Methadone Screen, Urine Negative Cutoff=300 ng/mL   PROPOXYPHENE, SCREEN Negative Cutoff=300 ng/mL   Meperidine Screen, Urine Negative Cutoff=200 ng/mL    Comment: This test was developed and its performance characteristics determined by [facility]. It has not been cleared or approved by the Food and Drug Administration.    Tramadol Screen, Urine Negative Cutoff=200 ng/mL   Fentanyl, Urine Negative Cutoff=2000 pg/mL    Comment: Test includes Fentanyl and Norfentanyl This test was developed and its performance characteristics determined by [facility]. It has not been cleared or approved by the Food and Drug Administration.    Buprenorphine, Urine See Final Results Cutoff=10 ng/mL    Comment: Drug brands, if listed herein, are trademarks of their respective owners.    Creatinine(Crt), U 123.8 20.0 - 300.0 mg/dL   SPECIFIC GRAVITY 1.028    Ph of Urine 7.3 4.5 - 8.9   Please Note: Comment     Comment: Drug-test results should be interpreted in the  context of clinical information. Patient metabolic variables, specific drug chemistry, and specimen characteristics can affect test outcome. Technical consultation is available if a test result is inconsistent with an expected outcome. ([email or web address] or call toll-free ([phone]) Drug brands, if listed herein, are trademarks of their respective owners.   Urine culture  Status: None   Collection Time: 05/13/16  2:38 PM  Result Value Ref Range   Urine Culture, Routine Final report    Urine Culture result 1 Comment     Comment: Mixed urogenital flora 50,000-100,000 colony forming units per mL   NuSwab Vaginitis Plus (VG+)     Status: Abnormal   Collection Time: 05/13/16  2:38 PM  Result Value Ref Range   Atopobium vaginae High - 2 (A) Score   BVAB 2 High - 2 (A) Score   Megasphaera 1 High - 2 (A) Score    Comment: Calculate total score by adding the 3 individual bacterial vaginosis (BV) marker scores together.  Total score is interpreted as follows: Total score 0-1: Indicates the absence of BV. Total score   2: Indeterminate for BV. Additional clinical                  data should be evaluated to establish a                  diagnosis. Total score 3-6: Indicates the presence of BV. This test was developed and its performance characteristics determined by [facility].  It has not been cleared or approved by the Food and Drug Administration.  The FDA has determined that such clearance or approval is not necessary.    Candida albicans, NAA Positive (A) Negative   Candida glabrata, NAA Negative Negative    Comment: This test was developed and its performance characteristics determined by [facility].  It has not been cleared or approved by the Food and Drug Administration.  The FDA has determined that such clearance or approval is not necessary.    Trich vag by NAA Negative Negative   Chlamydia trachomatis, NAA Negative Negative   Neisseria gonorrhoeae, NAA  Negative Negative  Pap IG w/ reflex to HPV when ASC-U     Status: None   Collection Time: 05/13/16  2:38 PM  Result Value Ref Range   DIAGNOSIS: Comment     Comment: UNSATISFACTORY FOR EVALUATION.   Specimen adequacy: Comment     Comment: Specimen processed and examined, but unsatisfactory for evaluation of epithelial abnormality because of obscuring bacteria.    CLINICIAN PROVIDED ICD10: Comment     Comment: Z12.4   Performed by: Comment     Comment: Diamantina Providence, Cytotechnologist (ASCP)   QC reviewed by: Comment     Comment: Neomia Dear, Supervisory Cytotechnologist (ASCP)   PAP SMEAR COMMENT .    PATHOLOGIST PROVIDED ICD10: Comment     Comment: R87.615   Note: Comment     Comment: The Pap smear is a screening test designed to aid in the detection of premalignant and malignant conditions of the uterine cervix.  It is not a diagnostic procedure and should not be used as the sole means of detecting cervical cancer.  Both false-positive and false-negative reports do occur.    Test Methodology CANCELED     Comment: The Thin Prep(R) Imager was unable to read this specimen.  Therefore a manual review was performed.  Result canceled by the ancillary    PAP REFLEX: Comment     Comment: The HPV DNA reflex criteria were not met with this specimen result therefore, no HPV testing was performed.   Buprenorphine Confirm, Urine     Status: Abnormal   Collection Time: 05/13/16  2:38 PM  Result Value Ref Range   Buprenorphine Positive (A) Cutoff=10    Comment: Confirmation performed by Mass Spectrometry   Buprenorphine  Positive (A)    Buprenorphine Confirm 126 Cutoff=10 ng/mL   Norbuprenorphine Positive (A)    Norbuprenorphine Confirm 30 Cutoff=10 ng/mL  Hemoglobin A1c     Status: None   Collection Time: 05/20/16  9:15 AM  Result Value Ref Range   Hgb A1c MFr Bld 5.3 4.8 - 5.6 %    Comment:          Pre-diabetes: 5.7 - 6.4          Diabetes: >6.4          Glycemic control for  adults with diabetes: <7.0    Est. average glucose Bld gHb Est-mCnc 105 mg/dL  Lipid panel     Status: None   Collection Time: 05/20/16  9:15 AM  Result Value Ref Range   Cholesterol, Total 148 100 - 199 mg/dL   Triglycerides 142 0 - 149 mg/dL   HDL 46 >39 mg/dL   VLDL Cholesterol Cal 28 5 - 40 mg/dL   LDL Calculated 74 0 - 99 mg/dL   Chol/HDL Ratio 3.2 0.0 - 4.4 ratio units    Comment:                                   T. Chol/HDL Ratio                                             Men  Women                               1/2 Avg.Risk  3.4    3.3                                   Avg.Risk  5.0    4.4                                2X Avg.Risk  9.6    7.1                                3X Avg.Risk 23.4   11.0   VITAMIN D 25 Hydroxy (Vit-D Deficiency, Fractures)     Status: Abnormal   Collection Time: 05/20/16  9:15 AM  Result Value Ref Range   Vit D, 25-Hydroxy 23.9 (L) 30.0 - 100.0 ng/mL    Comment: Vitamin D deficiency has been defined by the [place] practice guideline as a level of serum 25-OH vitamin D less than 20 ng/mL (1,2). The Endocrine Society went on to further define vitamin D insufficiency as a level between 21 and 29 ng/mL (2). 1. IOM (Institute of Medicine). 2010. Dietary reference    intakes for calcium and D. [place]: The    [employer]. 2. Holick MF, Binkley [place], Bischoff-Ferrari HA, et al.    Evaluation, treatment, and prevention of vitamin D    deficiency: an Endocrine Society clinical practice    guideline. JCEM. 2011 Jul; 96(7):1911-30.   TSH     Status: None   Collection Time: 05/20/16  9:15 AM  Result Value Ref Range   TSH 1.930 0.450 - 4.500 uIU/mL  Comp Met (CMET)     Status: Abnormal   Collection Time: 05/20/16  9:15 AM  Result Value Ref Range   Glucose 93 65 - 99 mg/dL   BUN 11 6 - 20 mg/dL   Creatinine, Ser 0.49 (L) 0.57 - 1.00 mg/dL   GFR calc non Af Amer 138 >59 mL/min/1.73   GFR calc  Af Amer 159 >59 mL/min/1.73   BUN/Creatinine Ratio 22 9 - 23   Sodium 138 134 - 144 mmol/L   Potassium 4.3 3.5 - 5.2 mmol/L   Chloride 98 96 - 106 mmol/L   CO2 22 18 - 29 mmol/L   Calcium 9.2 8.7 - 10.2 mg/dL   Total Protein 7.1 6.0 - 8.5 g/dL   Albumin 4.3 3.5 - 5.5 g/dL   Globulin, Total 2.8 1.5 - 4.5 g/dL   Albumin/Globulin Ratio 1.5 1.2 - 2.2   Bilirubin Total 0.3 0.0 - 1.2 mg/dL   Alkaline Phosphatase 90 39 - 117 IU/L   AST 13 0 - 40 IU/L   ALT 23 0 - 32 IU/L    Physical exam: not indicated  Assessment/Plan:   Encounter for preconception counseling  Vitamin D deficiency     1. Smoking cessation counseling   2. Encouraged balance diet and exercise   3. Discouraged drug use  4. Advised pt to download period tracking app on smart phone  5. Encouraged pt to start daily PNV when free of drugs and alcohol  6. Rx Vitamin D, see orders  7. RTC x 6 months for follow up visit   Diona Fanti, CNM  A total of 15 minutes were spent face-to-face with the patient during this encounter and over half of that time dealt with counseling and coordination of care.

## 2016-05-26 NOTE — Patient Instructions (Signed)
Preparing for Pregnancy If you are considering becoming pregnant, make an appointment to see your regular health care provider to learn how to prepare for a safe and healthy pregnancy (preconception care). During a preconception care visit, your health care provider will:  Do a complete physical exam, including a Pap test.  Take a complete medical history.  Give you information, answer your questions, and help you resolve problems.  Preconception checklist Medical history  Tell your health care provider about any current or past medical conditions. Your pregnancy or your ability to become pregnant may be affected by chronic conditions, such as diabetes, chronic hypertension, and thyroid problems.  Include your family's medical history as well as your partner's medical history.  Tell your health care provider about any history of STIs (sexually transmitted infections).These can affect your pregnancy. In some cases, they can be passed to your baby. Discuss any concerns that you have about STIs.  If indicated, discuss the benefits of genetic testing. This testing will show whether there are any genetic conditions that may be passed from you or your partner to your baby.  Tell your health care provider about: ? Any problems you have had with conception or pregnancy. ? Any medicines you take. These include vitamins, herbal supplements, and over-the-counter medicines. ? Your history of immunizations. Discuss any vaccinations that you may need.  Diet  Ask your health care provider what to include in a healthy diet that has a balance of nutrients. This is especially important when you are pregnant or preparing to become pregnant.  Ask your health care provider to help you reach a healthy weight before pregnancy. ? If you are overweight, you may be at higher risk for certain complications, such as high blood pressure, diabetes, and preterm birth. ? If you are underweight, you are more likely  to have a baby who has a low birth weight.  Lifestyle, work, and home  Let your health care provider know: ? About any lifestyle habits that you have, such as alcohol use, drug use, or smoking. ? About recreational activities that may put you at risk during pregnancy, such as downhill skiing and certain exercise programs. ? Tell your health care provider about any international travel, especially any travel to places with an active Zika virus outbreak. ? About harmful substances that you may be exposed to at work or at home. These include chemicals, pesticides, radiation, or even litter boxes. ? If you do not feel safe at home.  Mental health  Tell your health care provider about: ? Any history of mental health conditions, including feelings of depression, sadness, or anxiety. ? Any medicines that you take for a mental health condition. These include herbs and supplements.  Home instructions to prepare for pregnancy Lifestyle  Eat a balanced diet. This includes fresh fruits and vegetables, whole grains, lean meats, low-fat dairy products, healthy fats, and foods that are high in fiber. Ask to meet with a nutritionist or registered dietitian for assistance with meal planning and goals.  Get regular exercise. Try to be active for at least 30 minutes a day on most days of the week. Ask your health care provider which activities are safe during pregnancy.  Do not use any products that contain nicotine or tobacco, such as cigarettes and e-cigarettes. If you need help quitting, ask your health care provider.  Do not drink alcohol.  Do not take illegal drugs.  Maintain a healthy weight. Ask your health care provider what weight range is   right for you.  General instructions  Keep an accurate record of your menstrual periods. This makes it easier for your health care provider to determine your baby's due date.  Begin taking prenatal vitamins and folic acid supplements daily as directed by  your health care provider.  Manage any chronic conditions, such as high blood pressure and diabetes, as told by your health care provider. This is important.  How do I know that I am pregnant? You may be pregnant if you have been sexually active and you miss your period. Symptoms of early pregnancy include:  Mild cramping.  Very light vaginal bleeding (spotting).  Feeling unusually tired.  Nausea and vomiting (morning sickness).  If you have any of these symptoms and you suspect that you might be pregnant, you can take a home pregnancy test. These tests check for a hormone in your urine (human chorionic gonadotropin, or hCG). A woman's body begins to make this hormone during early pregnancy. These tests are very accurate. Wait until at least the first day after you miss your period to take one. If the test shows that you are pregnant (you get a positive result), call your health care provider to make an appointment for prenatal care. What should I do if I become pregnant?  Make an appointment with your health care provider as soon as you suspect you are pregnant.  Do not use any products that contain nicotine, such as cigarettes, chewing tobacco, and e-cigarettes. If you need help quitting, ask your health care provider.  Do not drink alcoholic beverages. Alcohol is related to a number of birth defects.  Avoid toxic odors and chemicals.  You may continue to have sexual intercourse if it does not cause pain or other problems, such as vaginal bleeding. This information is not intended to replace advice given to you by your health care provider. Make sure you discuss any questions you have with your health care provider. Document Released: 03/10/2008 Document Revised: 11/24/2015 Document Reviewed: 10/18/2015 Elsevier Interactive Patient Education  2017 [employer]vier Inc.  

## 2016-06-26 ENCOUNTER — Encounter: Payer: Self-pay | Admitting: Emergency Medicine

## 2016-06-26 ENCOUNTER — Emergency Department
Admission: EM | Admit: 2016-06-26 | Discharge: 2016-06-26 | Disposition: A | Discharge status: Home / Self Care | Payer: Self-pay | Attending: Emergency Medicine | Admitting: Emergency Medicine

## 2016-06-26 DIAGNOSIS — L02416 Cutaneous abscess of left lower limb: Secondary | ICD-10-CM | POA: Insufficient documentation

## 2016-06-26 DIAGNOSIS — L0291 Cutaneous abscess, unspecified: Secondary | ICD-10-CM

## 2016-06-26 DIAGNOSIS — F17210 Nicotine dependence, cigarettes, uncomplicated: Secondary | ICD-10-CM | POA: Insufficient documentation

## 2016-06-26 MED ORDER — LIDOCAINE HCL 1 % IJ SOLN
5.0000 mL | Freq: Once | INTRAMUSCULAR | Status: AC
  Administered 2016-06-26: 5 mL
  Filled 2016-06-26: qty 5

## 2016-06-26 MED ORDER — SULFAMETHOXAZOLE-TRIMETHOPRIM 800-160 MG PO TABS: 1.0000 | ORAL_TABLET | Freq: Two times a day (BID) | ORAL | 0 refills | Status: AC

## 2016-06-26 MED ORDER — LIDOCAINE HCL (PF) 1 % IJ SOLN
INTRAMUSCULAR | Status: AC
  Administered 2016-06-26: 5 mL
  Filled 2016-06-26: qty 5

## 2016-06-26 NOTE — ED Provider Notes (Signed)
[facility] Emergency Department Provider Note  ____________________________________________  Time seen: Approximately 3:18 PM  I have reviewed the triage vital signs and the nursing notes.   HISTORY  Chief Complaint Abscess   HPI Teresa Lowe is a 26 y.o. female presenting to the emergency department with a 1 cm x 1 cm cutaneous abscess of the skin overlying the inner left thigh that has been apparent for the past 5 days and 8/10 abscess discomfort. Patient has noticed no surrounding erythema. Patient states that she has had a history of "boils" in the past. No fever or chills. Patient states that she has attempted self expression, which has not relieved abscess discomfort. Patient denies dysuria, hematuria, nausea and vomiting.   Past Medical History:  Diagnosis Date  . Drug abuse   . Gallstones   . Kidney disease   . Renal insufficiency     There are no active problems to display for this patient.   Past Surgical History:  Procedure Laterality Date  . CESAREAN SECTION    . FRACTURE SURGERY     left hip  . JOINT REPLACEMENT     left    Prior to Admission medications   Medication Sig Start Date End Date Taking? Authorizing Provider  Vitamin D, Ergocalciferol, (DRISDOL) 50000 units CAPS capsule Take 1 capsule (50,000 Units total) by mouth every 7 (seven) days. 05/26/16   Gunnar BullaJenkins Michelle Lawhorn, CNM    Allergies Patient has no active allergies.  Family History  Problem Relation Age of Onset  . Breast cancer Paternal Grandmother   . Diabetes Paternal Grandmother   . Ovarian cancer Neg Hx   . Colon cancer Neg Hx     Social History Social History  Substance Use Topics  . Smoking status: Current Every Day Smoker    Packs/day: 1.00    Types: Cigarettes  . Smokeless tobacco: Never Used  . Alcohol use No    Review of Systems  Constitutional: No fever/chills Eyes: No visual changes. No discharge ENT: No upper respiratory  complaints. Cardiovascular: no chest pain. Respiratory: no cough. No SOB. Gastrointestinal: No abdominal pain.  No nausea, no vomiting.  No diarrhea.  No constipation. Musculoskeletal: Negative for musculoskeletal pain. Skin: Patient has cutaneous abscess.  Neurological: Negative for headaches, focal weakness or numbness. ____________________________________________   PHYSICAL EXAM:  VITAL SIGNS: ED Triage Vitals  Enc Vitals Group     BP 06/26/16 1429 117/69     Pulse Rate 06/26/16 1429 86     Resp 06/26/16 1429 20     Temp 06/26/16 1429 97.8 F (36.6 C)     Temp Source 06/26/16 1429 Oral     SpO2 06/26/16 1429 98 %     Weight 06/26/16 1428 184 lb (83.5 kg)     Height 06/26/16 1428 5\' 4"  (1.626 m)     Head Circumference --      Peak Flow --      Pain Score 06/26/16 1429 8     Pain Loc --      Pain Edu? --      Excl. in GC? --      Constitutional: Alert and oriented. Well appearing and in no acute distress. Eyes: Conjunctivae are normal. PERRL. EOMI. Head: Atraumatic. Cardiovascular: Normal rate, regular rhythm. Normal S1 and S2.  Good peripheral circulation. Respiratory: Normal respiratory effort without tachypnea or retractions. Lungs CTAB. Good air entry to the bases with no decreased or absent breath sounds. Musculoskeletal: Full range of motion to  all extremities. No gross deformities appreciated. Neurologic:  Normal speech and language. No gross focal neurologic deficits are appreciated.  Skin:  Patient has a 1 cm x 1 cm cutaneous abscess of the skin overlying the proximal inner left thigh that is modestly fluctuant to palpation. No induration. No surrounding cellulitis. ____________________________________________   LABS (all labs ordered are listed, but only abnormal results are displayed)  Labs Reviewed - No data to display ____________________________________________  EKG   ____________________________________________  RADIOLOGY  No results  found.  ____________________________________________    PROCEDURES  Procedure(s) performed:    Procedures    Medications - No data to display  INCISION AND DRAINAGE Performed by: Orvil Feil Consent: Verbal consent obtained. Risks and benefits: risks, benefits and alternatives were discussed Type: abscess  Body area: Proximal inner left thigh   Anesthesia: local infiltration  Incision was made with a scalpel.  Local anesthetic: lidocaine 1% without epinephrine  Anesthetic total: 2 ml  Complexity: complex Blunt dissection to break up loculations  Drainage: purulent  Drainage amount: 1 cc  Patient tolerance: Patient tolerated the procedure well with no immediate complications.    ____________________________________________   INITIAL IMPRESSION / ASSESSMENT AND PLAN / ED COURSE  Pertinent labs & imaging results that were available during my care of the patient were reviewed by me and considered in my medical decision making (see chart for details).  Review of the [place] CSRS was performed in accordance of the [facility] prior to dispensing any controlled drugs.     Assessment and Plan:  Cutaneous Abscess  Patient presents to the emergency department with a cutaneous abscess of the skin overlying the proximal left inner thigh. Patient underwent incision and drainage in the emergency department. She tolerated the procedure well and was discharged with Bactrim. Vital signs are reassuring at this time. All patient questions were answered.    ____________________________________________  FINAL CLINICAL IMPRESSION(S) / ED DIAGNOSES  Final diagnoses:  None      NEW MEDICATIONS STARTED DURING THIS VISIT:  New Prescriptions   No medications on file        This chart was dictated using voice recognition software/Dragon. Despite best efforts to proofread, errors can occur which can change the meaning. Any change was purely unintentional.    Orvil Feil, PA-C 06/26/16 1547    Loleta Rose, MD 06/26/16 [phone]

## 2016-06-26 NOTE — ED Triage Notes (Signed)
Pt c/o abscess to right groin and labial fold. Pt has hx of groin abscess. Pt using cleansing cloths and taking warms baths.  Pt states area is hard.

## 2016-06-30 ENCOUNTER — Emergency Department
Admission: EM | Admit: 2016-06-30 | Discharge: 2016-06-30 | Disposition: A | Discharge status: Home / Self Care | Payer: Self-pay | Attending: Emergency Medicine | Admitting: Emergency Medicine

## 2016-06-30 DIAGNOSIS — G8929 Other chronic pain: Secondary | ICD-10-CM | POA: Insufficient documentation

## 2016-06-30 DIAGNOSIS — M25552 Pain in left hip: Secondary | ICD-10-CM | POA: Insufficient documentation

## 2016-06-30 DIAGNOSIS — F17210 Nicotine dependence, cigarettes, uncomplicated: Secondary | ICD-10-CM | POA: Insufficient documentation

## 2016-06-30 DIAGNOSIS — M79671 Pain in right foot: Secondary | ICD-10-CM | POA: Insufficient documentation

## 2016-06-30 DIAGNOSIS — Z79899 Other long term (current) drug therapy: Secondary | ICD-10-CM | POA: Insufficient documentation

## 2016-06-30 MED ORDER — DEXAMETHASONE SODIUM PHOSPHATE 10 MG/ML IJ SOLN
10.0000 mg | Freq: Once | INTRAMUSCULAR | Status: AC
  Administered 2016-06-30: 10 mg via INTRAMUSCULAR
  Filled 2016-06-30: qty 1

## 2016-06-30 MED ORDER — PREDNISONE 10 MG PO TABS: ORAL_TABLET | ORAL | 0 refills | Status: DC

## 2016-06-30 NOTE — ED Notes (Signed)

## 2016-06-30 NOTE — Discharge Instructions (Signed)
Call and make an appointment with your [facility]opedist [facility].   Begin taking prednisone 3 tablets once a day for the next 3 days. Continue taking your Suboxone as directed by your doctor and keep your next appointment.
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
## 2016-06-30 NOTE — ED Triage Notes (Signed)
Pt c/o BL LE pain with standing, states she stands a lot for work .

## 2016-07-11 ENCOUNTER — Ambulatory Visit: Payer: Self-pay | Admitting: Pharmacy Technician

## 2016-07-12 NOTE — Progress Notes (Signed)
Patient scheduled for eligibility appointment at [facility].  Patient did not show for the appointment on 07/11/16 at 3:30p.m.  Patient did not reschedule eligibility appointment.  [facility] will be unable to provide additional medication assistance until eligibility is determined.  Sherilyn Dacosta Care Manager [facility]

## 2016-08-22 ENCOUNTER — Encounter: Payer: Self-pay | Admitting: Emergency Medicine

## 2016-08-22 ENCOUNTER — Emergency Department
Admission: EM | Admit: 2016-08-22 | Discharge: 2016-08-22 | Disposition: A | Discharge status: Home / Self Care | Payer: Self-pay | Attending: Emergency Medicine | Admitting: Emergency Medicine

## 2016-08-22 DIAGNOSIS — G510 Bell's palsy: Secondary | ICD-10-CM | POA: Insufficient documentation

## 2016-08-22 DIAGNOSIS — Z79899 Other long term (current) drug therapy: Secondary | ICD-10-CM | POA: Insufficient documentation

## 2016-08-22 DIAGNOSIS — F17210 Nicotine dependence, cigarettes, uncomplicated: Secondary | ICD-10-CM | POA: Insufficient documentation

## 2016-08-22 LAB — BASIC METABOLIC PANEL
ANION GAP: 9 (ref 5–15)
BUN: 13 mg/dL (ref 6–20)
CO2: 23 mmol/L (ref 22–32)
CREATININE: 0.52 mg/dL (ref 0.44–1.00)
Calcium: 9.1 mg/dL (ref 8.9–10.3)
Chloride: 106 mmol/L (ref 101–111)
GFR calc non Af Amer: >60 mL/min (ref >60)
Glucose, Bld: 150 mg/dL — ABNORMAL HIGH (ref 65–99)
POTASSIUM: 3.5 mmol/L (ref 3.5–5.1)
SODIUM: 138 mmol/L (ref 135–145)

## 2016-08-22 LAB — URINALYSIS, COMPLETE (UACMP) WITH MICROSCOPIC
Bilirubin Urine: NEGATIVE
GLUCOSE, UA: NEGATIVE mg/dL
KETONES UR: NEGATIVE mg/dL
Nitrite: NEGATIVE
PH: 7 (ref 5.0–8.0)
Protein, ur: 30 mg/dL — AB
SPECIFIC GRAVITY, URINE: 1.019 (ref 1.005–1.030)

## 2016-08-22 LAB — CBC
HCT: 40.1 % (ref 35.0–47.0)
Hemoglobin: 13.8 g/dL (ref 12.0–16.0)
MCH: 28.9 pg (ref 26.0–34.0)
MCHC: 34.5 g/dL (ref 32.0–36.0)
MCV: 83.9 fL (ref 80.0–100.0)
PLATELETS: 286 10*3/uL (ref 150–440)
RBC: 4.78 MIL/uL (ref 3.80–5.20)
RDW: 15.8 % — AB (ref 11.5–14.5)
WBC: 7.9 10*3/uL (ref 3.6–11.0)

## 2016-08-22 MED ORDER — PREDNISONE 20 MG PO TABS: 60.0000 mg | ORAL_TABLET | Freq: Every day | ORAL | 0 refills | Status: AC

## 2016-08-22 MED ORDER — ACYCLOVIR 400 MG PO TABS: 800.0000 mg | ORAL_TABLET | Freq: Every day | ORAL | 0 refills | Status: AC

## 2016-08-22 NOTE — Discharge Instructions (Signed)
Please seek medical attention for any high fevers, chest pain, shortness of breath, change in behavior, persistent vomiting, bloody stool or any other new or concerning symptoms.  

## 2016-08-22 NOTE — ED Notes (Signed)

## 2016-08-22 NOTE — ED Provider Notes (Signed)
[facility] Emergency Department Provider Note   ____________________________________________   I have reviewed the triage vital signs and the nursing notes.   HISTORY  Chief Complaint Weakness   History limited by: Not Limited   HPI Teresa Lowe is a 26 y.o. female who presents to the emergency department today because of concerns for right face weakness. Patient states his symptoms started 2 days ago. It has been constant. It has gotten gradually worse. The patient states that she has noticed right facial droop. She also has noticed some change in the sensation of her tongue. Patient denies any symptoms like this previously. She denies any headache. She did state that a couple days prior to this she had a little bit of right-sided thoracic pain although denies any fevers or rashes. No recent illness.    Past Medical History:  Diagnosis Date  . Drug abuse   . Gallstones   . Kidney disease   . Renal insufficiency     There are no active problems to display for this patient.   Past Surgical History:  Procedure Laterality Date  . CESAREAN SECTION    . FRACTURE SURGERY     left hip  . JOINT REPLACEMENT     left    Prior to Admission medications   Medication Sig Start Date End Date Taking? Authorizing Provider  SUBOXONE 8-2 MG FILM Place 2 Film under the tongue daily. 08/16/16  Yes [provider]  Vitamin D, Ergocalciferol, (DRISDOL) 50000 units CAPS capsule Take 1 capsule (50,000 Units total) by mouth every 7 (seven) days. 05/26/16  Yes Lawhorn, Vanessa Dutton, CNM  predniSONE (DELTASONE) 10 MG tablet Take 3 tabs once a day for 3 days Patient not taking: Reported on 08/22/2016 06/30/16   Tommi Rumps, PA-C    Allergies Patient has no known allergies.  Family History  Problem Relation Age of Onset  . Breast cancer Paternal Grandmother   . Diabetes Paternal Grandmother   . Ovarian cancer Neg Hx   . Lowe cancer Neg Hx     Social  History Social History  Substance Use Topics  . Smoking status: Current Every Day Smoker    Packs/day: 1.00    Types: Cigarettes  . Smokeless tobacco: Never Used  . Alcohol use No    Review of Systems Constitutional: No fever/chills Eyes: Increased tearing of the right eye ENT: Decreased sensation of tongue Cardiovascular: Denies chest pain. Respiratory: Denies shortness of breath. Gastrointestinal: Right sided upper abdominal pain now resolved.  Genitourinary: Negative for dysuria. Musculoskeletal: Negative for back pain. Skin: Negative for rash. Neurological: Right facial droop.   ____________________________________________   PHYSICAL EXAM:  VITAL SIGNS: ED Triage Vitals  Enc Vitals Group     BP 08/22/16 1312 (!) 146/63     Pulse Rate 08/22/16 1312 90     Resp 08/22/16 1312 18     Temp --      Temp Source 08/22/16 1312 Oral     SpO2 08/22/16 1312 100 %     Weight 08/22/16 1312 180 lb (81.6 kg)     Height 08/22/16 1312 5\' 4"  (1.626 m)     Head Circumference --      Peak Flow --      Pain Score 08/22/16 1311 0   Constitutional: Alert and oriented. Well appearing and in no distress. Eyes: Conjunctivae are normal. Normal extraocular movements. ENT   Head: Normocephalic and atraumatic.   Nose: No congestion/rhinnorhea.   Mouth/Throat: Mucous membranes  are moist.   Neck: No stridor. Hematological/Lymphatic/Immunilogical: No cervical lymphadenopathy. Cardiovascular: Normal rate, regular rhythm.  No murmurs, rubs, or gallops.  Respiratory: Normal respiratory effort without tachypnea nor retractions. Breath sounds are clear and equal bilaterally. No wheezes/rales/rhonchi. Gastrointestinal: Soft and non tender. No rebound. No guarding.  Genitourinary: Deferred Musculoskeletal: Normal range of motion in all extremities. No lower extremity edema. Neurologic:  Normal speech and language. Obvious right-sided facial droop. Patient unable to completely close her  right eye. Patient unable to move her right forehead. Strength and sensation intact in upper and lower extremities.  Skin:  Skin is warm, dry and intact. No rash noted. Psychiatric: Mood and affect are normal. Speech and behavior are normal. Patient exhibits appropriate insight and judgment.  ____________________________________________    LABS (pertinent positives/negatives)  Labs Reviewed  BASIC METABOLIC PANEL - Abnormal; Notable for the following:       Result Value   Glucose, Bld 150 (*)    All other components within normal limits  CBC - Abnormal; Notable for the following:    RDW 15.8 (*)    All other components within normal limits  URINALYSIS, COMPLETE (UACMP) WITH MICROSCOPIC - Abnormal; Notable for the following:    Color, Urine YELLOW (*)    APPearance CLOUDY (*)    Hgb urine dipstick MODERATE (*)    Protein, ur 30 (*)    Leukocytes, UA SMALL (*)    Bacteria, UA FEW (*)    Squamous Epithelial / LPF TOO NUMEROUS TO COUNT (*)    All other components within normal limits  POC URINE PREG, ED     ____________________________________________   EKG  I, Phineas SemenGraydon Khala Tarte, attending physician, personally viewed and interpreted this EKG  EKG Time: 1324 Rate: 60 Rhythm: normal sinus rhythm Axis: normal Intervals: qtc 386 QRS: narrow, RSR' in V1, V2 ST changes: no st elevation Impression: borderline ekg   ____________________________________________    RADIOLOGY  None   ____________________________________________   PROCEDURES  Procedures  ____________________________________________   INITIAL IMPRESSION / ASSESSMENT AND PLAN / ED COURSE  Pertinent labs & imaging results that were available during my care of the patient were reviewed by me and considered in my medical decision making (see chart for details).  Patient presents to the emergency department today with symptoms consistent with Bell's palsy. We'll give patient prescription for steroids  and antivirals. Discussed with patient and care.  ____________________________________________   FINAL CLINICAL IMPRESSION(S) / ED DIAGNOSES  Final diagnoses:  Bell's palsy     Note: This dictation was prepared with Dragon dictation. Any transcriptional errors that result from this process are unintentional     Phineas SemenGoodman, Charlot Gouin, MD 08/22/16 [phone]

## 2016-08-22 NOTE — ED Triage Notes (Signed)
Pt to ed with c/o right side facial drooping and right side pain and weakness. Pt states it started on Saturday night with pain in right side and weakness, then progressed to right side facial drooping and weakness.  Pt alert and oriented at triage.

## 2016-08-30 ENCOUNTER — Emergency Department
Admission: EM | Admit: 2016-08-30 | Discharge: 2016-08-30 | Disposition: A | Discharge status: Home / Self Care | Payer: Self-pay | Attending: Emergency Medicine | Admitting: Emergency Medicine

## 2016-08-30 ENCOUNTER — Encounter: Payer: Self-pay | Admitting: Emergency Medicine

## 2016-08-30 DIAGNOSIS — F17210 Nicotine dependence, cigarettes, uncomplicated: Secondary | ICD-10-CM | POA: Insufficient documentation

## 2016-08-30 DIAGNOSIS — R300 Dysuria: Secondary | ICD-10-CM | POA: Insufficient documentation

## 2016-08-30 DIAGNOSIS — R21 Rash and other nonspecific skin eruption: Secondary | ICD-10-CM | POA: Insufficient documentation

## 2016-08-30 LAB — URINALYSIS, COMPLETE (UACMP) WITH MICROSCOPIC
BILIRUBIN URINE: NEGATIVE
GLUCOSE, UA: NEGATIVE mg/dL
KETONES UR: NEGATIVE mg/dL
LEUKOCYTES UA: NEGATIVE
NITRITE: NEGATIVE
PROTEIN: NEGATIVE mg/dL
Specific Gravity, Urine: 1.023 (ref 1.005–1.030)
pH: 6 (ref 5.0–8.0)

## 2016-08-30 LAB — CHLAMYDIA/NGC RT PCR (ARMC ONLY)
Chlamydia Tr: NOT DETECTED
N gonorrhoeae: NOT DETECTED

## 2016-08-30 LAB — POCT PREGNANCY, URINE: Preg Test, Ur: NEGATIVE

## 2016-08-30 LAB — WET PREP, GENITAL
CLUE CELLS WET PREP: NONE SEEN
SPERM: NONE SEEN
Trich, Wet Prep: NONE SEEN
Yeast Wet Prep HPF POC: NONE SEEN

## 2016-08-30 MED ORDER — DIPHENHYDRAMINE HCL 50 MG/ML IJ SOLN
50.0000 mg | Freq: Once | INTRAMUSCULAR | Status: AC
  Administered 2016-08-30: 50 mg via INTRAMUSCULAR
  Filled 2016-08-30: qty 1

## 2016-08-30 NOTE — ED Triage Notes (Signed)
Pt c/o itching rash since she was started on meds back on the 14 th of may. May be allergic to meds that were given, last dose taken on two days ago. Pt denies any difficulty breathing or swallowing. NAD.

## 2016-08-30 NOTE — ED Notes (Signed)
NAD noted at time of D/C. Pt denies questions or concerns. Pt ambulatory to the lobby at this time.  

## 2016-08-30 NOTE — ED Provider Notes (Signed)
[facility] Emergency Department Provider Note ____________________________________________  Time seen: 7:51 AM  I have reviewed the triage vital signs and the nursing notes.  HISTORY  Chief Complaint  Medication Reaction   HPI Teresa Lowe is a 26 y.o. female is here with complaint of itching rash that started after her starting on medication that was prescribed for her through the emergency room on May 14. At that time she was diagnosed with Bell's palsy and started on acyclovir and prednisone. Patient states that she began breaking out shortly after that. She discontinued taking medication 2 days ago. She denies any difficulty with breathing, swallowing or talking. Patient has not taken any other medications. She also is complaining of a strong odor of her urine. She states that she was treated for urinary tract infection last month but does not recall the antibiotic. She states that she was symptom free until just recently. She denies any abdominal pain, fever, chills, nausea or vomiting. She is uncertain whether she is having a vaginal discharge or not.    Past Medical History:  Diagnosis Date  . Drug abuse   . Gallstones   . Kidney disease   . Renal insufficiency     There are no active problems to display for this patient.   Past Surgical History:  Procedure Laterality Date  . CESAREAN SECTION    . FRACTURE SURGERY     left hip  . JOINT REPLACEMENT     left    Prior to Admission medications   Medication Sig Start Date End Date Taking? Authorizing Provider  SUBOXONE 8-2 MG FILM Place 2 Film under the tongue daily. 08/16/16   [provider]  Vitamin D, Ergocalciferol, (DRISDOL) 50000 units CAPS capsule Take 1 capsule (50,000 Units total) by mouth every 7 (seven) days. 05/26/16   Gunnar BullaLawhorn, Jenkins Michelle, CNM    Allergies Patient has no known allergies.  Family History  Problem Relation Age of Onset  . Breast cancer Paternal  Grandmother   . Diabetes Paternal Grandmother   . Ovarian cancer Neg Hx   . Colon cancer Neg Hx     Social History Social History  Substance Use Topics  . Smoking status: Current Every Day Smoker    Packs/day: 1.00    Types: Cigarettes  . Smokeless tobacco: Never Used  . Alcohol use No    Review of Systems  Constitutional: Negative for fever. Cardiovascular: Negative for chest pain. Respiratory: Negative for shortness of breath. Gastrointestinal: Negative for abdominal pain, vomiting and diarrhea. Genitourinary: Positive for dysuria. Musculoskeletal: Negative for back pain. Skin: Positive for rash. Neurological: Negative for headaches, focal weakness or numbness. ____________________________________________  PHYSICAL EXAM:  VITAL SIGNS: ED Triage Vitals [08/30/16 0732]  Enc Vitals Group     BP 118/75     Pulse Rate 68     Resp 18     Temp 98.1 F (36.7 C)     Temp Source Oral     SpO2 100 %     Weight 180 lb (81.6 kg)     Height 5\' 4"  (1.626 m)     Head Circumference      Peak Flow      Pain Score      Pain Loc      Pain Edu?      Excl. in GC?     Constitutional: Alert and oriented. Well appearing and in no distress. Head: Normocephalic and atraumatic. Eyes: Conjunctivae are normal.  Mouth/Throat: Mucous membranes are  moist. Neck: Supple.  Hematological/Lymphatic/Immunological: No cervical lymphadenopathy. Cardiovascular: Normal rate, regular rhythm. Normal distal pulses. Respiratory: Normal respiratory effort. No wheezes/rales/rhonchi. Gastrointestinal: Soft and nontender. No distention. Genitourinary: Vaginal swabs were obtained. There is some minimal tenderness on palpation of the adnexal areas bilaterally but no masses were noted. No vaginal discharge noted. Musculoskeletal: Nontender with normal range of motion in all extremities.  Neurologic:  Normal gait without ataxia. Normal speech and language. No gross focal neurologic deficits are  appreciated. Skin:  Skin is warm, dry and intact. No rash noted. Psychiatric: Mood and affect are normal. Patient exhibits appropriate insight and judgment. ____________________________________________   LABS (pertinent positives/negatives) Wet prep was negative. Pregnancy test negative. Chlamydia and gonorrhea test was negative. Urinalysis showed 6-30 rbc's. Wbc's 0-5 and rare bacteria.   INITIAL IMPRESSION / ASSESSMENT AND PLAN / ED COURSE  Patient was given Benadryl 50 mg while in the department. 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 She is to continue Benadryl as needed for itching. She has already discontinued taking the acyclovir and will continue not taking it. Patient has already discontinued taking prednisone. She will follow-up with [facility]  clinic acute care or Dr. Gwen Pounds at [facility] if any continued problems with rash.    ____________________________________________  FINAL CLINICAL IMPRESSION(S) / ED DIAGNOSES  Final diagnoses:  Rash and nonspecific skin eruption  Dysuria     Tommi Rumps, PA-C 08/30/16 1459    Merrily Brittle, MD 08/30/16 [phone]

## 2016-08-30 NOTE — Discharge Instructions (Signed)
Discontinue taking acyclovir. You may continue taking Benadryl one or 2 capsules every 6 hours as needed for itching. Follow-up with Dr. Gwen PoundsKowalski, the dermatologist, if any continued problems with skin rash. Increase water and fruit juices. Discontinue sodas at this time. Today's tests did not show a urinary tract infection or vaginal infection. You will be called on the results of the culture should they be positive. [facility] if any continued problems.

## 2016-09-22 ENCOUNTER — Emergency Department
Admission: EM | Admit: 2016-09-22 | Discharge: 2016-09-22 | Disposition: A | Discharge status: Home / Self Care | Payer: Self-pay | Attending: Emergency Medicine | Admitting: Emergency Medicine

## 2016-09-22 ENCOUNTER — Encounter: Payer: Self-pay | Admitting: Emergency Medicine

## 2016-09-22 DIAGNOSIS — R202 Paresthesia of skin: Secondary | ICD-10-CM | POA: Insufficient documentation

## 2016-09-22 DIAGNOSIS — Z96642 Presence of left artificial hip joint: Secondary | ICD-10-CM | POA: Insufficient documentation

## 2016-09-22 DIAGNOSIS — F17210 Nicotine dependence, cigarettes, uncomplicated: Secondary | ICD-10-CM | POA: Insufficient documentation

## 2016-09-22 DIAGNOSIS — Z79899 Other long term (current) drug therapy: Secondary | ICD-10-CM | POA: Insufficient documentation

## 2016-09-22 DIAGNOSIS — M25552 Pain in left hip: Secondary | ICD-10-CM | POA: Insufficient documentation

## 2016-09-22 DIAGNOSIS — M79652 Pain in left thigh: Secondary | ICD-10-CM | POA: Insufficient documentation

## 2016-09-22 DIAGNOSIS — N289 Disorder of kidney and ureter, unspecified: Secondary | ICD-10-CM | POA: Insufficient documentation

## 2016-09-22 MED ORDER — CYCLOBENZAPRINE HCL 5 MG PO TABS: 5.0000 mg | ORAL_TABLET | Freq: Three times a day (TID) | ORAL | 0 refills | Status: DC | PRN

## 2016-09-22 MED ORDER — DICLOFENAC SODIUM 75 MG PO TBEC: 75.0000 mg | DELAYED_RELEASE_TABLET | Freq: Two times a day (BID) | ORAL | 1 refills | Status: DC

## 2016-09-22 NOTE — ED Triage Notes (Signed)
Presents with pain and numbness to left hip and thigh  States she notices this upon waking   dneis any specific injury  And states feeling returns in 2-3 hours

## 2016-09-22 NOTE — Discharge Instructions (Signed)
Your exam is otherwise normal today. Follow-up with [facility] or Dr. Martha ClanKrasinski at [facility] for continued symptoms. Take the prescription medicines as needed. Return to the ED for leg weakness or foot drop.
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
## 2016-11-01 ENCOUNTER — Encounter: Payer: Self-pay | Admitting: Student in an Organized Health Care Education/Training Program

## 2016-11-01 ENCOUNTER — Ambulatory Visit
Payer: Self-pay | Attending: Student in an Organized Health Care Education/Training Program | Admitting: Student in an Organized Health Care Education/Training Program

## 2016-11-01 VITALS — BP 122/72 | HR 94 | Temp 98.2°F | Resp 18 | Ht 64.0 in | Wt 185.0 lb

## 2016-11-01 DIAGNOSIS — Z765 Malingerer [conscious simulation]: Secondary | ICD-10-CM | POA: Insufficient documentation

## 2016-11-01 DIAGNOSIS — N289 Disorder of kidney and ureter, unspecified: Secondary | ICD-10-CM | POA: Insufficient documentation

## 2016-11-01 DIAGNOSIS — F1120 Opioid dependence, uncomplicated: Secondary | ICD-10-CM | POA: Insufficient documentation

## 2016-11-01 DIAGNOSIS — Z9889 Other specified postprocedural states: Secondary | ICD-10-CM | POA: Insufficient documentation

## 2016-11-01 DIAGNOSIS — F410 Panic disorder [episodic paroxysmal anxiety] without agoraphobia: Secondary | ICD-10-CM | POA: Insufficient documentation

## 2016-11-01 DIAGNOSIS — G8929 Other chronic pain: Secondary | ICD-10-CM | POA: Insufficient documentation

## 2016-11-01 DIAGNOSIS — F17210 Nicotine dependence, cigarettes, uncomplicated: Secondary | ICD-10-CM | POA: Insufficient documentation

## 2016-11-01 DIAGNOSIS — M25552 Pain in left hip: Secondary | ICD-10-CM | POA: Insufficient documentation

## 2016-11-01 DIAGNOSIS — F411 Generalized anxiety disorder: Secondary | ICD-10-CM | POA: Insufficient documentation

## 2016-11-01 DIAGNOSIS — F1910 Other psychoactive substance abuse, uncomplicated: Secondary | ICD-10-CM

## 2016-11-01 DIAGNOSIS — Z8781 Personal history of (healed) traumatic fracture: Secondary | ICD-10-CM | POA: Insufficient documentation

## 2016-11-01 DIAGNOSIS — F329 Major depressive disorder, single episode, unspecified: Secondary | ICD-10-CM | POA: Insufficient documentation
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
## 2016-11-01 NOTE — Progress Notes (Signed)
Safety precautions to be maintained throughout the outpatient stay will include: orient to surroundings, keep bed in low position, maintain call bell within reach at all times, provide assistance with transfer out of bed and ambulation.  

## 2016-11-01 NOTE — Patient Instructions (Signed)
-  Referral to physical therapy- recommend starting with aquatic based therapy first and then transitioning to land based therapy -Recommend obtaining over the counter Lidocaine cream that you can apply to your left hip region after application of heat. -Continue with psychiatry -Follow up as needed

## 2016-11-22 ENCOUNTER — Ambulatory Visit (INDEPENDENT_AMBULATORY_CARE_PROVIDER_SITE_OTHER): Payer: Self-pay | Admitting: Certified Nurse Midwife

## 2016-11-22 ENCOUNTER — Encounter: Payer: Self-pay | Admitting: Certified Nurse Midwife

## 2016-11-22 VITALS — BP 110/70 | HR 69 | Wt 190.4 lb

## 2016-11-22 DIAGNOSIS — R87615 Unsatisfactory cytologic smear of cervix: Secondary | ICD-10-CM

## 2016-11-22 DIAGNOSIS — Z3169 Encounter for other general counseling and advice on procreation: Secondary | ICD-10-CM

## 2016-11-22 DIAGNOSIS — Z87898 Personal history of other specified conditions: Secondary | ICD-10-CM

## 2016-11-22 DIAGNOSIS — F1911 Other psychoactive substance abuse, in remission: Secondary | ICD-10-CM

## 2016-11-22 LAB — POCT URINE PREGNANCY: PREG TEST UR: NEGATIVE

## 2016-11-22 NOTE — Patient Instructions (Signed)
Infertility Infertility is when you are unable to get pregnant (conceive) after a year of having sex regularly without using birth control. Infertility can also mean that a woman is not able to carry a pregnancy to full term. Both women and men can have fertility problems. What causes infertility? What Causes Infertility in Women? There are many possible causes of infertility in women. For some women, the cause of infertility is not known (unexplained infertility). Infertility can also be linked to more than one cause. Infertility problems in women can be caused by problems with the menstrual cycle or reproductive organs, certain medical conditions, and factors related to lifestyle and age.  Problems with your menstrual cycle can interfere with your ovaries producing eggs (ovulation). This can make it difficult to get pregnant. This includes having a menstrual cycle that is very long, very short, or irregular.  Problems with reproductive organs can include: ? An abnormally narrow cervix or a cervix that does not remain closed during a pregnancy. ? A blockage in your fallopian tubes. ? An abnormally shaped uterus. ? Uterine fibroids. This is a tissue mass (tumor) that can develop on your uterus.  Medical conditions that can affect a woman's fertility include: ? Polycystic ovarian syndrome (PCOS). This is a hormonal disorder that can cause small cysts to grow on your ovaries. This is the most common cause of infertility in women. ? Endometriosis. This is a condition in which the tissue that lines your uterus (endometrium) grows outside of its normal location. ? Primary ovary insufficiency. This is when your ovaries stop producing eggs and hormones before the age of 12. ? Sexually transmitted diseases, such as chlamydia or gonorrhea. These infections can cause scarring in your fallopian tubes. This makes it difficult for eggs to reach your uterus. ? Autoimmune disorders. These are disorders in which  your immune system attacks normal, healthy cells. ? Hormone imbalances.  Other factors include: ? Age. A woman's fertility declines with age, especially after her mid-59s. ? Being under- or overweight. ? Drinking too much alcohol. ? Using drugs. ? Exercising excessively. ? Being exposed to environmental toxins, such as radiation, pesticides, and certain chemicals.  What Causes Infertility in Men? There are many causes of infertility in men. Infertility can be linked to more than one cause. Infertility problems in men can be caused by problems with sperm or the reproductive organs, certain medical conditions, and factors related to lifestyle and age. Some men have unexplained infertility.  Problems with sperm. Infertility can result if there is a problem producing: ? Enough sperm (low sperm count). ? Enough normally-shaped sperm (sperm morphology). ? Sperm that are able to reach the egg (poor motility).  Infertility can also be caused by: ? A problem with hormones. ? Enlarged veins (varicoceles), cysts (spermatoceles), or tumors of the testicles. ? Sexual dysfunction. ? Injury to the testicles. ? A birth defect, such as not having the tubes that carry sperm (vas deferens).  Medical conditions that can affect a man's fertility include: ? Diabetes. ? Cancer treatments, such as chemotherapy or radiation. ? Klinefelter syndrome. This is an inherited genetic disorder. ? Thyroid problems, such as an under- or overactive thyroid. ? Cystic fibrosis. ? Sexually transmitted diseases.  Other factors include: ? Age. A man's fertility declines with age. ? Drinking too much alcohol. ? Using drugs. ? Being exposed to environmental toxins, such as pesticides and lead.  What are the symptoms of infertility? Being unable to get pregnant after one year of having regular  sex without using birth control is the only sign of infertility. How is infertility diagnosed? In order to be diagnosed with  infertility, both partners will have a physical exam. Both partners will also have an extensive medical and sexual history taken. If there is no obvious reason for infertility, additional tests may be done. What Tests Will Women Have? Women may first have tests to check whether they are ovulating each month. The tests may include:  Blood tests to check hormone levels.  An ultrasound of the ovaries. This looks for possible problems on or in the ovaries.  Taking a small sample of the tissue that lines the uterus for examination under a microscope (endometrial biopsy).  Women who are ovulating may have additional tests. These may include:  Hysterosalpingography. ? This is an X-ray of the fallopian tubes and uterus taken after a specific type of dye is injected. ? This test can show the shape of the uterus and whether the fallopian tubes are open.  Laparoscopy. ? In this test, a lighted tube (laparoscope) is used to look for problems in the fallopian tubes and other female organs.  Transvaginal ultrasound. ? This is an imaging test to check for abnormalities of the uterus and ovaries. ? A health care provider can use this test to count the number of follicles on the ovaries.  Hysteroscopy. ? This test involves using a lighted tube to examine the cervix and inside the uterus. ? It is done to find any abnormalities inside the uterus.  What Tests Will Men Have? Tests for men's infertility includes:  Semen tests to check sperm count, morphology, and motility.  Blood tests to check for hormone levels.  Taking a small sample of tissue from inside a testicle (biopsy). This is examined under a microscope.  Blood tests to check for genetic abnormalities (genetic testing).  How are women treated for infertility? Treatment depends on the cause of infertility. Most cases of infertility in women are treated with medicine or surgery.  Women may take medicine to: ? Correct ovulation  problems. ? Treat other health conditions, such as PCOS.  Surgery may be done to: ? Repair damage to the ovaries, fallopian tubes, cervix, or uterus. ? Remove growths from the uterus. ? Remove scar tissue from the uterus, pelvis, or other female organs.  How are men treated for infertility? Treatment depends on the cause of infertility. Most cases of infertility in men are treated with medicine or surgery.  Men may take medicine to: ? Correct hormone problems. ? Treat other health conditions. ? Treat sexual dysfunction.  Surgery may be done to: ? Remove blockages in the reproductive tract. ? Correct other structural problems of the reproductive tract.  What is assisted reproductive technology? Assisted reproductive technology (ART) refers to all treatments and procedures that combine eggs and sperm outside the body to try to help a couple conceive. ART is often combined with fertility drugs to stimulate ovulation. Sometimes ART is done using eggs retrieved from another woman's body (donor eggs) or from previously frozen fertilized eggs (embryos). There are different types of ART. These include:  Intrauterine insemination (IUI). ? In this procedure, sperm is placed directly into a woman's uterus with a long, thin tube. ? This may be most effective for infertility caused by sperm problems, including low sperm count and low motility. ? Can be used in combination with fertility drugs.  In vitro fertilization (IVF). ? This is often done when a woman's fallopian tubes are blocked or when  a man has low sperm counts. ? Fertility drugs stimulate the ovaries to produce multiple eggs. Once mature, these eggs are removed from the body and combined with the sperm to be fertilized. ? These fertilized eggs are then placed in the woman's uterus.  This information is not intended to replace advice given to you by your health care provider. Make sure you discuss any questions you have with your  health care provider. Document Released: 03/31/2003 Document Revised: 08/28/2015 Document Reviewed: 12/11/2013 Elsevier Interactive Patient Education  2018 [employer] Mittelschmerz Mittelschmerz is pain in the lower abdomen that is felt between periods.  The pain affects one side of the abdomen. The side that it affects may change from month to month.  The pain may be mild or severe.  The pain may last from minutes to hours. It does not last longer than 1-2 days.  The pain can happen with nausea and light vaginal bleeding.  Mittelschmerz is common among women. It is caused by the growth and release of an egg from an ovary, and it is a natural part of the ovulation cycle. It often happens about two weeks after a woman's period ends. Follow these instructions at home: Pay attention to any changes in your condition. Take these actions to help with your pain:  Try soaking in a hot bath.  Take over-the-counter and prescription medicines only as told by your health care provider.  Keep all follow-up visits as told by your health care provider. This is important.  Contact a health care provider if:  You have very bad pain most months.  You have abdominal pain that lasts longer than 24 hours.  Your pain medicine is not helping.  You have a fever.  You have nausea or vomiting that will not go away.  You miss your period.  You have vaginal bleeding between your periods that is heavier than spotting. This information is not intended to replace advice given to you by your health care provider. Make sure you discuss any questions you have with your health care provider. Document Released: 03/18/2002 Document Revised: 09/03/2015 Document Reviewed: 06/23/2014 Elsevier Interactive Patient Education  2018 [employer]. Preparing for Pregnancy If you are considering becoming pregnant, make an appointment to see your regular health care provider to learn how to prepare for a safe and  healthy pregnancy (preconception care). During a preconception care visit, your health care provider will:  Do a complete physical exam, including a Pap test.  Take a complete medical history.  Give you information, answer your questions, and help you resolve problems.  Preconception checklist Medical history  Tell your health care provider about any current or past medical conditions. Your pregnancy or your ability to become pregnant may be affected by chronic conditions, such as diabetes, chronic hypertension, and thyroid problems.  Include your family's medical history as well as your partner's medical history.  Tell your health care provider about any history of STIs (sexually transmitted infections).These can affect your pregnancy. In some cases, they can be passed to your baby. Discuss any concerns that you have about STIs.  If indicated, discuss the benefits of genetic testing. This testing will show whether there are any genetic conditions that may be passed from you or your partner to your baby.  Tell your health care provider about: ? Any problems you have had with conception or pregnancy. ? Any medicines you take. These include vitamins, herbal supplements, and over-the-counter medicines. ? Your history of immunizations. Discuss any  vaccinations that you may need.  Diet  Ask your health care provider what to include in a healthy diet that has a balance of nutrients. This is especially important when you are pregnant or preparing to become pregnant.  Ask your health care provider to help you reach a healthy weight before pregnancy. ? If you are overweight, you may be at higher risk for certain complications, such as high blood pressure, diabetes, and preterm birth. ? If you are underweight, you are more likely to have a baby who has a low birth weight.  Lifestyle, work, and home  Let your health care provider know: ? About any lifestyle habits that you have, such as  alcohol use, drug use, or smoking. ? About recreational activities that may put you at risk during pregnancy, such as downhill skiing and certain exercise programs. ? Tell your health care provider about any international travel, especially any travel to places with an active [place] virus outbreak. ? About harmful substances that you may be exposed to at work or at home. These include chemicals, pesticides, radiation, or even litter boxes. ? If you do not feel safe at home.  Mental health  Tell your health care provider about: ? Any history of mental health conditions, including feelings of depression, sadness, or anxiety. ? Any medicines that you take for a mental health condition. These include herbs and supplements.  Home instructions to prepare for pregnancy Lifestyle  Eat a balanced diet. This includes fresh fruits and vegetables, whole grains, lean meats, low-fat dairy products, healthy fats, and foods that are high in fiber. Ask to meet with a nutritionist or registered dietitian for assistance with meal planning and goals.  Get regular exercise. Try to be active for at least 30 minutes a day on most days of the week. Ask your health care provider which activities are safe during pregnancy.  Do not use any products that contain nicotine or tobacco, such as cigarettes and e-cigarettes. If you need help quitting, ask your health care provider.  Do not drink alcohol.  Do not take illegal drugs.  Maintain a healthy weight. Ask your health care provider what weight range is right for you.  General instructions  Keep an accurate record of your menstrual periods. This makes it easier for your health care provider to determine your baby's due date.  Begin taking prenatal vitamins and folic acid supplements daily as directed by your health care provider.  Manage any chronic conditions, such as high blood pressure and diabetes, as told by your health care provider. This is  important.  How do I know that I am pregnant? You may be pregnant if you have been sexually active and you miss your period. Symptoms of early pregnancy include:  Mild cramping.  Very light vaginal bleeding (spotting).  Feeling unusually tired.  Nausea and vomiting (morning sickness).  If you have any of these symptoms and you suspect that you might be pregnant, you can take a home pregnancy test. These tests check for a hormone in your urine (human chorionic gonadotropin, or hCG). A woman's body begins to make this hormone during early pregnancy. These tests are very accurate. Wait until at least the first day after you miss your period to take one. If the test shows that you are pregnant (you get a positive result), call your health care provider to make an appointment for prenatal care. What should I do if I become pregnant?  Make an appointment with your health care  provider as soon as you suspect you are pregnant.  Do not use any products that contain nicotine, such as cigarettes, chewing tobacco, and e-cigarettes. If you need help quitting, ask your health care provider.  Do not drink alcoholic beverages. Alcohol is related to a number of birth defects.  Avoid toxic odors and chemicals.  You may continue to have sexual intercourse if it does not cause pain or other problems, such as vaginal bleeding. This information is not intended to replace advice given to you by your health care provider. Make sure you discuss any questions you have with your health care provider. Document Released: 03/10/2008 Document Revised: 11/24/2015 Document Reviewed: 10/18/2015 Elsevier Interactive Patient Education  2017 [employer]

## 2016-11-23 LAB — PAP IG W/ RFLX HPV ASCU: PAP SMEAR COMMENT: 0

## 2016-11-24 NOTE — Progress Notes (Signed)
Please contact patient and let her know Pap came back normal or negative. Encourage to set up MyChart. Thanks, JML

## 2016-11-28 NOTE — Progress Notes (Signed)
GYN ENCOUNTER NOTE  Subjective:       Teresa Lowe is a 26 y.o. G19P1001 female here for follow up visit. Original seen by myself on 05/13/2016 for evaluation of irregular menses and pelvic pain. Patient was unable to remember if she had an IUD placed after the birth of her child approximately 10 years ago.   Patient desires pregnancy, but has been unable to conceive for the last few years.   Denies difficulty breathing or respiratory distress, chest pain, abdominal pain, excessive vaginal bleeding, and leg pain or swelling.   History significant for substance abuse-opioids and heroin. Patient is no longer being prescribed suboxone   Gynecologic History  Patient's last menstrual period was 10/25/2016 (exact date).  Contraception: none  Last Pap: 05/2016-insufficient cells.   Obstetric History  OB History  Gravida Para Term Preterm AB Living  1 1 1  0 0 1  SAB TAB Ectopic Multiple Live Births  0 0 0   1    # Outcome Date GA Lbr Len/2nd Weight Sex Delivery Anes PTL Lv  1 Term 2009    M CS-LTranv   LIV      Past Medical History:  Diagnosis Date  . Drug abuse   . Gallstones   . Kidney disease   . Renal insufficiency     Past Surgical History:  Procedure Laterality Date  . CESAREAN SECTION    . FRACTURE SURGERY     left hip  . JOINT REPLACEMENT     left    Current Outpatient Prescriptions on File Prior to Visit  Medication Sig Dispense Refill  . cyclobenzaprine (FLEXERIL) 5 MG tablet Take 1 tablet (5 mg total) by mouth 3 (three) times daily as needed for muscle spasms. 15 tablet 0  . diclofenac (VOLTAREN) 75 MG EC tablet Take 1 tablet (75 mg total) by mouth 2 (two) times daily. 30 tablet 1  . SUBOXONE 8-2 MG FILM Place 2 Film under the tongue daily.  0  . Vitamin D, Ergocalciferol, (DRISDOL) 50000 units CAPS capsule Take 1 capsule (50,000 Units total) by mouth every 7 (seven) days. 12 capsule 0   No current facility-administered medications on file prior to visit.      No Known Allergies  Social History   Social History  . Marital status: Single    Spouse name: N/A  . Number of children: N/A  . Years of education: N/A   Occupational History  . Not on file.   Social History Main Topics  . Smoking status: Former Smoker    Packs/day: 1.00    Types: Cigarettes    Quit date: 11/15/2016  . Smokeless tobacco: Never Used  . Alcohol use No  . Drug use: No  . Sexual activity: Yes    Birth control/ protection: None   Other Topics Concern  . Not on file   Social History Narrative   ** Merged History Encounter **        Family History  Problem Relation Age of Onset  . Breast cancer Paternal Grandmother   . Diabetes Paternal Grandmother   . Ovarian cancer Neg Hx   . Colon cancer Neg Hx     The following portions of the patient's history were reviewed and updated as appropriate: allergies, current medications, past family history, past medical history, past social history, past surgical history and problem list.  Review of Systems  Review of Systems - Negative except as noted above.  History obtained from the patient.   Objective:  BP 110/70   Pulse 69   Wt 190 lb 6.4 oz (86.4 kg)   LMP 10/25/2016 (Exact Date)   BMI 32.68 kg/m   CONSTITUTIONAL: Well-developed, well-nourished female in no acute distress.   HENT:  Normocephalic, atraumatic.   NECK: Normal range of motion, supple, no masses.   SKIN: Skin is warm and dry. No rash noted. Not diaphoretic. No erythema. No pallor.  NEUROLGIC: Alert and oriented to person, place, and time.   PSYCHIATRIC: Normal mood and affect.   CARDIOVASCULAR:Not Examined  RESPIRATORY: Not Examined  BREASTS: Not Examined  ABDOMEN: Soft, non distended; Non tender.  No Organomegaly.  PELVIC:  External Genitalia: Normal  BUS: Normal  Vagina: Normal  Cervix: Normal, Pap collected  Uterus: Normal size, shape,consistency, mobile  Adnexa: Normal   MUSCULOSKELETAL: Normal range of motion.  No tenderness.  No cyanosis, clubbing, or edema.  Assessment:   1. Encounter for preconception consultation  - POCT urine pregnancy - Monitor Drug Profile 14(MW)  2. Encounter for repeat Pap smear due to previous insuff cervical cells  - Pap IG w/ reflex to HPV when ASC-U  3. History of drug abuse  - Monitor Drug Profile 14(MW)   Plan:   Pap collected. Will contact patient via MyChart with results.   Labs: UDS  Encouraged patient to maximize personal health through lifestyle modifications including sobriety.   Continue ovulation tracking and home treatment measures.   Advised patient would not start infertility treatment at this time.   RTC x six (6) months for further evaluation.   Patient visible upset and angered during today's visit outcome and current plan of care.    Gunnar Bulla, CNM

## 2017-01-06 ENCOUNTER — Emergency Department
Admission: EM | Admit: 2017-01-06 | Discharge: 2017-01-06 | Disposition: A | Discharge status: Home / Self Care | Payer: Self-pay | Attending: Emergency Medicine | Admitting: Emergency Medicine

## 2017-01-06 ENCOUNTER — Emergency Department: Payer: Self-pay

## 2017-01-06 DIAGNOSIS — N3090 Cystitis, unspecified without hematuria: Secondary | ICD-10-CM

## 2017-01-06 DIAGNOSIS — Z87891 Personal history of nicotine dependence: Secondary | ICD-10-CM | POA: Insufficient documentation

## 2017-01-06 DIAGNOSIS — K529 Noninfective gastroenteritis and colitis, unspecified: Secondary | ICD-10-CM

## 2017-01-06 DIAGNOSIS — R110 Nausea: Secondary | ICD-10-CM

## 2017-01-06 DIAGNOSIS — Z79899 Other long term (current) drug therapy: Secondary | ICD-10-CM | POA: Insufficient documentation

## 2017-01-06 DIAGNOSIS — R1084 Generalized abdominal pain: Secondary | ICD-10-CM

## 2017-01-06 LAB — COMPREHENSIVE METABOLIC PANEL
ALBUMIN: 4 g/dL (ref 3.5–5.0)
ALT: 26 U/L (ref 14–54)
AST: 24 U/L (ref 15–41)
Alkaline Phosphatase: 66 U/L (ref 38–126)
Anion gap: 10 (ref 5–15)
BUN: 13 mg/dL (ref 6–20)
CHLORIDE: 103 mmol/L (ref 101–111)
CO2: 24 mmol/L (ref 22–32)
Calcium: 9.1 mg/dL (ref 8.9–10.3)
Creatinine, Ser: 0.57 mg/dL (ref 0.44–1.00)
GFR calc Af Amer: >60 mL/min (ref >60)
GFR calc non Af Amer: >60 mL/min (ref >60)
GLUCOSE: 122 mg/dL — AB (ref 65–99)
Potassium: 4 mmol/L (ref 3.5–5.1)
SODIUM: 137 mmol/L (ref 135–145)
TOTAL PROTEIN: 7.5 g/dL (ref 6.5–8.1)
Total Bilirubin: 0.8 mg/dL (ref 0.3–1.2)

## 2017-01-06 LAB — URINALYSIS, COMPLETE (UACMP) WITH MICROSCOPIC
BILIRUBIN URINE: NEGATIVE
GLUCOSE, UA: NEGATIVE mg/dL
Ketones, ur: NEGATIVE mg/dL
LEUKOCYTES UA: NEGATIVE
Nitrite: NEGATIVE
PH: 6 (ref 5.0–8.0)
Protein, ur: 30 mg/dL — AB
Specific Gravity, Urine: 1.027 (ref 1.005–1.030)

## 2017-01-06 LAB — POCT PREGNANCY, URINE: Preg Test, Ur: NEGATIVE

## 2017-01-06 LAB — CBC
HCT: 41.6 % (ref 35.0–47.0)
Hemoglobin: 14.4 g/dL (ref 12.0–16.0)
MCH: 29.7 pg (ref 26.0–34.0)
MCHC: 34.6 g/dL (ref 32.0–36.0)
MCV: 85.8 fL (ref 80.0–100.0)
PLATELETS: 290 10*3/uL (ref 150–440)
RBC: 4.85 MIL/uL (ref 3.80–5.20)
RDW: 14.2 % (ref 11.5–14.5)
WBC: 9.1 10*3/uL (ref 3.6–11.0)

## 2017-01-06 LAB — LIPASE, BLOOD: LIPASE: 17 U/L (ref 11–51)

## 2017-01-06 MED ORDER — CEPHALEXIN 500 MG PO CAPS: 500.0000 mg | ORAL_CAPSULE | Freq: Two times a day (BID) | ORAL | 0 refills | Status: AC

## 2017-01-06 MED ORDER — ONDANSETRON 8 MG PO TBDP: 8.0000 mg | ORAL_TABLET | Freq: Three times a day (TID) | ORAL | 0 refills | Status: DC | PRN

## 2017-01-06 NOTE — ED Provider Notes (Signed)
[facility] Emergency Department Provider Note   ____________________________________________   I have reviewed the triage vital signs and the nursing notes.   HISTORY  Chief Complaint Abdominal Pain and Back Pain    HPI Teresa Lowe is a 26 y.o. female presents emergency department with central lower abdominal pain, pelvic and flank pain for 2 days. She also reports nausea, vomiting and diarrhea. Patient has not taken her temperature however felt she may have had a temperature prior to coming to the emergency department. Patient denies any sick contacts with similar symptoms. Patient reports very mild dysuria without increased or decreased urinary frequency or difficulty passing urine. Patient denies fever, chills, headache, vision changes, chest pain, chest tightness, shortness of breath, abdominal pain, nausea and vomiting.  Past Medical History:  Diagnosis Date  . Drug abuse   . Gallstones   . Kidney disease   . Renal insufficiency     Patient Active Problem List   Diagnosis Date Noted  . Drug abuse 11/01/2016  . Heroin addiction (HCC) 11/01/2016  . Closed displaced transverse fracture of acetabulum (HCC) 10/02/2015  . Noncompliance 10/02/2015    Past Surgical History:  Procedure Laterality Date  . CESAREAN SECTION    . FRACTURE SURGERY     left hip  . JOINT REPLACEMENT     left    Prior to Admission medications   Medication Sig Start Date End Date Taking? Authorizing Provider  cephALEXin (KEFLEX) 500 MG capsule Take 1 capsule (500 mg total) by mouth 2 (two) times daily. 01/06/17 01/13/17  Falisha Osment M, PA-C  cyclobenzaprine (FLEXERIL) 5 MG tablet Take 1 tablet (5 mg total) by mouth 3 (three) times daily as needed for muscle spasms. 09/22/16   Menshew, Charlesetta Ivory, PA-C  diclofenac (VOLTAREN) 75 MG EC tablet Take 1 tablet (75 mg total) by mouth 2 (two) times daily. 09/22/16   Menshew, Charlesetta Ivory, PA-C  ondansetron (ZOFRAN ODT) 8 MG  disintegrating tablet Take 1 tablet (8 mg total) by mouth every 8 (eight) hours as needed for nausea or vomiting. 01/06/17   Diany Formosa M, PA-C  SUBOXONE 8-2 MG FILM Place 2 Film under the tongue daily. 08/16/16   [provider]  Vitamin D, Ergocalciferol, (DRISDOL) 50000 units CAPS capsule Take 1 capsule (50,000 Units total) by mouth every 7 (seven) days. 05/26/16   Gunnar Bulla, CNM    Allergies Patient has no known allergies.  Family History  Problem Relation Age of Onset  . Breast cancer Paternal Grandmother   . Diabetes Paternal Grandmother   . Ovarian cancer Neg Hx   . Colon cancer Neg Hx     Social History Social History  Substance Use Topics  . Smoking status: Former Smoker    Packs/day: 1.00    Types: Cigarettes    Quit date: 11/15/2016  . Smokeless tobacco: Never Used  . Alcohol use No    Review of Systems Constitutional: Negative for fever/chills Cardiovascular: Denies chest pain. Respiratory: Denies cough. Denies shortness of breath. Gastrointestinal: Positive for lower abdominal pain and pelvicpain.  Nausea, vomiting, diarrhea. Genitourinary: Positive for mild dysuria Musculoskeletal: Negative for back pain. Skin: Negative for rash. Neurological: Negative for headaches.  ____________________________________________   PHYSICAL EXAM:  VITAL SIGNS: ED Triage Vitals  Enc Vitals Group     BP 01/06/17 1059 134/80     Pulse Rate 01/06/17 1059 96     Resp 01/06/17 1059 18     Temp 01/06/17 1059 98.5  F (36.9 C)     Temp Source 01/06/17 1059 Oral     SpO2 01/06/17 1059 98 %     Weight 01/06/17 1100 180 lb (81.6 kg)     Height 01/06/17 1100  (1.626 m)     Head Circumference --      Peak Flow --      Pain Score 01/06/17 1059 7     Pain Loc --      Pain Edu? --      Excl. in GC? --     Constitutional: Alert and oriented. Well appearing and in no acute distress.  Eyes: Conjunctivae are normal. PERRL. EOMI  Head: Normocephalic and  atraumatic. Cardiovascular: Normal rate, regular rhythm.Peri [place] peripheral circulation. Respiratory: Normal respiratory effort without tachypnea or retractions. Lungs CTAB. No wheezes/rales/rhonchi. Hematological/Lymphatic/Immunological: No cervical lymphadenopathy. Cardiovascular: Normal rate, regular rhythm. Normal distal pulses. Gastrointestinal: Bowel sounds 4 quadrants. Soft and mild tenderness to palpation lower abdomen and pelvic. No guarding or rigidity. No palpable masses. No distention. Right side CVA tenderness.  Genitourinary: Mild dysuria without increased or decreased urine frequency or difficulty passing urine flow. No vaginal discharge.  Musculoskeletal: Nontender with normal range of motion in all extremities. Negative for joint aches or body aches. Neurologic: Normal speech and language.  Skin:  Skin is warm, dry and intact. No rash noted. Psychiatric: Mood and affect are normal. Speech and behavior are normal. Patient exhibits appropriate insight and judgement.  ____________________________________________   LABS (all labs ordered are listed, but only abnormal results are displayed)  Labs Reviewed  COMPREHENSIVE METABOLIC PANEL - Abnormal; Notable for the following:       Result Value   Glucose, Bld 122 (*)    All other components within normal limits  URINALYSIS, COMPLETE (UACMP) WITH MICROSCOPIC - Abnormal; Notable for the following:    Color, Urine YELLOW (*)    APPearance HAZY (*)    Hgb urine dipstick MODERATE (*)    Protein, ur 30 (*)    Bacteria, UA RARE (*)    Squamous Epithelial / LPF 6-30 (*)    All other components within normal limits  LIPASE, BLOOD  CBC  POCT PREGNANCY, URINE   ____________________________________________  EKG none ____________________________________________  RADIOLOGY CT renal study IMPRESSION: Mild nonspecific wall thickening of a few jejunal loops in the left mid abdomen which may be due to an regional enteritis of  infectious or inflammatory nature. Otherwise, no acute findings in the abdomen/pelvis.  Single 4 mm gallstone.  3.9 cm cystic process over the right ovary likely a follicular cyst. ____________________________________________   PROCEDURES  Procedure(s) performed: no    Critical Care performed: no ____________________________________________   INITIAL IMPRESSION / ASSESSMENT AND PLAN / ED COURSE  Pertinent labs & imaging results that were available during my care of the patient were reviewed by me and considered in my medical decision making (see chart for details). Patient presented to the emergency department with central lower abdominal pain, pelvic and flank pain with nausea, vomiting and diarrhea for 2 days. Patient also notes bilateral flank pain with right sided CVA tenderness. History, physical exam labs and imaging symptoms are likely consistent gastroenteritis and cystitis. CT renal study findings commented on areas of the jejunal loops with mild nonspecific wall thickening may be due to enteritis of infection or inflammatory nature. No pyelonephritis or nephrolithiasis was noted on imaging. and physical exam was reassuring to rule out pyelonephritis and nephrolithiasis. Patient will be given course of cephalexin for  antibiotic coverage and encouraged to hydrate. Patient informed of clinical course, understand medical decision-making process, and agree with plan. Patient was advised to follow up with PCP as needed and was also advised to return to the emergency department for symptoms that change or worsen.   ____________________________________________   FINAL CLINICAL IMPRESSION(S) / ED DIAGNOSES  Final diagnoses:  Generalized abdominal pain  Nausea  Cystitis  Gastroenteritis presumed infectious       NEW MEDICATIONS STARTED DURING THIS VISIT:  Discharge Medication List as of 01/06/2017  2:26 PM    START taking these medications   Details  cephALEXin (KEFLEX)  500 MG capsule Take 1 capsule (500 mg total) by mouth 2 (two) times daily., Starting Fri 01/06/2017, Until Fri 01/13/2017, Print    ondansetron (ZOFRAN ODT) 8 MG disintegrating tablet Take 1 tablet (8 mg total) by mouth every 8 (eight) hours as needed for nausea or vomiting., Starting Fri 01/06/2017, Print         Note:  This document was prepared using Dragon voice recognition software and may include unintentional dictation errors.    Clois Comber, PA-C 01/07/17 1610    Dionne Bucy, MD 01/07/17 [phone]

## 2017-01-06 NOTE — Discharge Instructions (Signed)
Take medication as prescribed. Return to emergency department if symptoms worsen and follow-up with PCP as needed.   °

## 2017-01-06 NOTE — ED Triage Notes (Addendum)
Pt c/o lower abdominal and pelvic pressure x 2 days. NVD. Pt alert and oriented X4, active, cooperative, pt in NAD. RR even and unlabored, color WNL.

## 2017-02-01 ENCOUNTER — Encounter: Payer: Self-pay | Admitting: Emergency Medicine

## 2017-02-01 ENCOUNTER — Emergency Department
Admission: EM | Admit: 2017-02-01 | Discharge: 2017-02-01 | Disposition: A | Discharge status: Home / Self Care | Payer: Self-pay | Attending: Emergency Medicine | Admitting: Emergency Medicine

## 2017-02-01 DIAGNOSIS — N3000 Acute cystitis without hematuria: Secondary | ICD-10-CM | POA: Insufficient documentation

## 2017-02-01 DIAGNOSIS — F17210 Nicotine dependence, cigarettes, uncomplicated: Secondary | ICD-10-CM | POA: Insufficient documentation

## 2017-02-01 DIAGNOSIS — Z79899 Other long term (current) drug therapy: Secondary | ICD-10-CM | POA: Insufficient documentation

## 2017-02-01 DIAGNOSIS — K8020 Calculus of gallbladder without cholecystitis without obstruction: Secondary | ICD-10-CM | POA: Insufficient documentation

## 2017-02-01 DIAGNOSIS — Z96642 Presence of left artificial hip joint: Secondary | ICD-10-CM | POA: Insufficient documentation

## 2017-02-01 LAB — URINALYSIS, COMPLETE (UACMP) WITH MICROSCOPIC
Bilirubin Urine: NEGATIVE
Glucose, UA: NEGATIVE mg/dL
Ketones, ur: NEGATIVE mg/dL
Nitrite: NEGATIVE
Protein, ur: NEGATIVE mg/dL
Specific Gravity, Urine: 1.015 (ref 1.005–1.030)
pH: 6 (ref 5.0–8.0)

## 2017-02-01 LAB — POCT PREGNANCY, URINE: PREG TEST UR: NEGATIVE

## 2017-02-01 MED ORDER — NITROFURANTOIN MONOHYD MACRO 100 MG PO CAPS: 100.0000 mg | ORAL_CAPSULE | Freq: Two times a day (BID) | ORAL | 0 refills | Status: AC

## 2017-02-01 MED ORDER — IBUPROFEN 600 MG PO TABS: 600.0000 mg | ORAL_TABLET | Freq: Three times a day (TID) | ORAL | 0 refills | Status: DC | PRN

## 2017-02-01 MED ORDER — TRAMADOL HCL 50 MG PO TABS
50.0000 mg | ORAL_TABLET | Freq: Once | ORAL | Status: AC
  Administered 2017-02-01: 50 mg via ORAL
  Filled 2017-02-01: qty 1

## 2017-02-01 MED ORDER — IBUPROFEN 600 MG PO TABS
600.0000 mg | ORAL_TABLET | Freq: Once | ORAL | Status: AC
  Administered 2017-02-01: 600 mg via ORAL
  Filled 2017-02-01: qty 1

## 2017-02-01 MED ORDER — OXYCODONE-ACETAMINOPHEN 5-325 MG PO TABS: 1.0000 | ORAL_TABLET | Freq: Four times a day (QID) | ORAL | 0 refills | Status: DC | PRN

## 2017-02-01 NOTE — ED Provider Notes (Signed)
[facility] Emergency Department Provider Note   ____________________________________________   First MD Initiated Contact with Patient 02/01/17 1415     (approximate)  I have reviewed the triage vital signs and the nursing notes.   HISTORY  Chief Complaint Urinary Frequency and Back Pain    HPI Teresa Lowe is a 26 y.o. female patient complaining of bilateral flank pain that radiates to the mid abdomen. Patient also state there is urinary frequency patient states she knows a pink discharge on tissue paper 2 days ago. Patient denies fever associated this complaint. Patient has a history of cystitis. Patient is rating the pain as a 10 over 10.Patient described a pain as "achy". No palliative measures for complaint. Patient seen last night had a CT scan which showed a single 4 mm gallstone and a 3.9 cm cystic lesion on the right ovary. Patient also had nonspecific wall thickening of the jujunal loop.    Past Medical History:  Diagnosis Date  . Drug abuse (HCC)   . Gallstones   . Kidney disease   . Renal insufficiency     Patient Active Problem List   Diagnosis Date Noted  . Drug abuse (HCC) 11/01/2016  . Heroin addiction (HCC) 11/01/2016  . Closed displaced transverse fracture of acetabulum (HCC) 10/02/2015  . Noncompliance 10/02/2015    Past Surgical History:  Procedure Laterality Date  . CESAREAN SECTION    . FRACTURE SURGERY     left hip  . JOINT REPLACEMENT     left    Prior to Admission medications   Medication Sig Start Date End Date Taking? Authorizing Provider  cyclobenzaprine (FLEXERIL) 5 MG tablet Take 1 tablet (5 mg total) by mouth 3 (three) times daily as needed for muscle spasms. 09/22/16   Menshew, Charlesetta Ivory, PA-C  diclofenac (VOLTAREN) 75 MG EC tablet Take 1 tablet (75 mg total) by mouth 2 (two) times daily. 09/22/16   Menshew, Charlesetta Ivory, PA-C  ibuprofen (ADVIL,MOTRIN) 600 MG tablet Take 1 tablet (600 mg total) by mouth  every 8 (eight) hours as needed. 02/01/17   Joni Reining, PA-C  nitrofurantoin, macrocrystal-monohydrate, (MACROBID) 100 MG capsule Take 1 capsule (100 mg total) by mouth 2 (two) times daily. 02/01/17 02/08/17  Joni Reining, PA-C  ondansetron (ZOFRAN ODT) 8 MG disintegrating tablet Take 1 tablet (8 mg total) by mouth every 8 (eight) hours as needed for nausea or vomiting. 01/06/17   Little, Traci M, PA-C  oxyCODONE-acetaminophen (ROXICET) 5-325 MG tablet Take 1 tablet by mouth every 6 (six) hours as needed for moderate pain. 02/01/17   Joni Reining, PA-C  SUBOXONE 8-2 MG FILM Place 2 Film under the tongue daily. 08/16/16   [provider]  Vitamin D, Ergocalciferol, (DRISDOL) 50000 units CAPS capsule Take 1 capsule (50,000 Units total) by mouth every 7 (seven) days. 05/26/16   Gunnar Bulla, CNM    Allergies Patient has no known allergies.  Family History  Problem Relation Age of Onset  . Breast cancer Paternal Grandmother   . Diabetes Paternal Grandmother   . Ovarian cancer Neg Hx   . Colon cancer Neg Hx     Social History Social History  Substance Use Topics  . Smoking status: Current Every Day Smoker    Packs/day: 1.00    Types: Cigarettes    Last attempt to quit: 11/15/2016  . Smokeless tobacco: Never Used  . Alcohol use No    Review of Systems Constitutional: No fever/chills  Eyes: No visual changes. ENT: No sore throat. Cardiovascular: Denies chest pain. Respiratory: Denies shortness of breath. Gastrointestinal: No abdominal pain.  No nausea, no vomiting.  No diarrhea.  No constipation. Genitourinary: Positive for dysuria. Musculoskeletal: Bilateral flank pain Skin: Negative for rash. Neurological: Negative for headaches, focal weakness or numbness.   ____________________________________________   PHYSICAL EXAM:  VITAL SIGNS: ED Triage Vitals  Enc Vitals Group     BP 02/01/17 1345 137/87     Pulse Rate 02/01/17 1345 83     Resp --       Temp 02/01/17 1345 98.2 F (36.8 C)     Temp Source 02/01/17 1345 Oral     SpO2 02/01/17 1345 99 %     Weight 02/01/17 1346 180 lb (81.6 kg)     Height 02/01/17 1346 5\' 4"  (1.626 m)     Head Circumference --      Peak Flow --      Pain Score 02/01/17 1347 10     Pain Loc --      Pain Edu? --      Excl. in GC? --    Constitutional: Alert and oriented. Well appearing and in no acute distress. Cardiovascular: Normal rate, regular rhythm. Grossly normal heart sounds.  Good peripheral circulation. Respiratory: Normal respiratory effort.  No retractions. Lungs CTAB. Gastrointestinal: Soft and nontender. No distention. No abdominal bruits. Bilateral CVA tenderness. Genitourinary: Deferred Musculoskeletal: No lower extremity tenderness nor edema.  No joint effusions. Neurologic:  Normal speech and language. No gross focal neurologic deficits are appreciated. No gait instability. Skin:  Skin is warm, dry and intact. No rash noted. Psychiatric: Mood and affect are normal. Speech and behavior are normal.  ____________________________________________   LABS (all labs ordered are listed, but only abnormal results are displayed)  Labs Reviewed  URINALYSIS, COMPLETE (UACMP) WITH MICROSCOPIC - Abnormal; Notable for the following:       Result Value   Color, Urine YELLOW (*)    APPearance HAZY (*)    Hgb urine dipstick MODERATE (*)    Leukocytes, UA SMALL (*)    Bacteria, UA RARE (*)    Squamous Epithelial / LPF 6-30 (*)    All other components within normal limits  POC URINE PREG, ED  POCT PREGNANCY, URINE   ____________________________________________  EKG   ____________________________________________  RADIOLOGY  No results found.  ____________________________________________   PROCEDURES  Procedure(s) performed: None  Procedures  Critical Care performed: No  ____________________________________________   INITIAL IMPRESSION / ASSESSMENT AND PLAN / ED COURSE  As  part of my medical decision making, I reviewed the following data within the electronic MEDICAL RECORD NUMBER Notes from prior ED visits and [place] Controlled Substance Database   Pain secondary to cystitis without hematuria. Discussed (with patient. Advised patient to follow-up with urology for definitive evaluation and treatment.      ____________________________________________   FINAL CLINICAL IMPRESSION(S) / ED DIAGNOSES  Final diagnoses:  Acute cystitis without hematuria      NEW MEDICATIONS STARTED DURING THIS VISIT:  New Prescriptions   IBUPROFEN (ADVIL,MOTRIN) 600 MG TABLET    Take 1 tablet (600 mg total) by mouth every 8 (eight) hours as needed.   NITROFURANTOIN, MACROCRYSTAL-MONOHYDRATE, (MACROBID) 100 MG CAPSULE    Take 1 capsule (100 mg total) by mouth 2 (two) times daily.   OXYCODONE-ACETAMINOPHEN (ROXICET) 5-325 MG TABLET    Take 1 tablet by mouth every 6 (six) hours as needed for moderate pain.     Note:  This document was prepared using Dragon voice recognition software and may include unintentional dictation errors.    Joni ReiningSmith, Wang [place] K, PA-C 02/01/17 1519    Emily FilbertWilliams, Jonathan E, MD 02/01/17 [phone]

## 2017-02-01 NOTE — ED Notes (Signed)
Pt returned to room at this time. PA Ron at bedside.

## 2017-02-01 NOTE — ED Triage Notes (Signed)
Pt to ED c/o lower mid abd pain that radiates to bilateral kidneys, urinary frequency with discharge.  States noticed light pink on toilet paper 2 days ago.

## 2017-02-01 NOTE — ED Notes (Signed)
Pt and visitor are not in room upon Ron PA entering room again. No personal belongings in room except some loose change on bedside table. Will watch room to see if pt or visitor return.

## 2017-02-19 ENCOUNTER — Other Ambulatory Visit: Payer: Self-pay

## 2017-02-19 ENCOUNTER — Emergency Department
Admission: EM | Admit: 2017-02-19 | Discharge: 2017-02-19 | Disposition: A | Discharge status: Home / Self Care | Payer: Self-pay | Attending: Emergency Medicine | Admitting: Emergency Medicine

## 2017-02-19 DIAGNOSIS — F17210 Nicotine dependence, cigarettes, uncomplicated: Secondary | ICD-10-CM | POA: Insufficient documentation

## 2017-02-19 DIAGNOSIS — L304 Erythema intertrigo: Secondary | ICD-10-CM

## 2017-02-19 DIAGNOSIS — R234 Changes in skin texture: Secondary | ICD-10-CM | POA: Insufficient documentation

## 2017-02-19 DIAGNOSIS — B760 Ancylostomiasis: Secondary | ICD-10-CM | POA: Insufficient documentation

## 2017-02-19 DIAGNOSIS — N760 Acute vaginitis: Secondary | ICD-10-CM

## 2017-02-19 DIAGNOSIS — N3090 Cystitis, unspecified without hematuria: Secondary | ICD-10-CM

## 2017-02-19 DIAGNOSIS — B9689 Other specified bacterial agents as the cause of diseases classified elsewhere: Secondary | ICD-10-CM

## 2017-02-19 DIAGNOSIS — N898 Other specified noninflammatory disorders of vagina: Secondary | ICD-10-CM | POA: Insufficient documentation

## 2017-02-19 DIAGNOSIS — Z79899 Other long term (current) drug therapy: Secondary | ICD-10-CM | POA: Insufficient documentation

## 2017-02-19 LAB — URINALYSIS, COMPLETE (UACMP) WITH MICROSCOPIC
BILIRUBIN URINE: NEGATIVE
GLUCOSE, UA: NEGATIVE mg/dL
Ketones, ur: NEGATIVE mg/dL
NITRITE: NEGATIVE
PH: 6 (ref 5.0–8.0)
Protein, ur: 30 mg/dL — AB
SPECIFIC GRAVITY, URINE: 1.023 (ref 1.005–1.030)

## 2017-02-19 LAB — CHLAMYDIA/NGC RT PCR (ARMC ONLY)
Chlamydia Tr: NOT DETECTED
N gonorrhoeae: NOT DETECTED

## 2017-02-19 LAB — WET PREP, GENITAL
SPERM: NONE SEEN
TRICH WET PREP: NONE SEEN
Yeast Wet Prep HPF POC: NONE SEEN

## 2017-02-19 MED ORDER — SULFAMETHOXAZOLE-TRIMETHOPRIM 800-160 MG PO TABS: 1.0000 | ORAL_TABLET | Freq: Two times a day (BID) | ORAL | 0 refills | Status: DC

## 2017-02-19 MED ORDER — METRONIDAZOLE 500 MG PO TABS: 500.0000 mg | ORAL_TABLET | Freq: Two times a day (BID) | ORAL | 0 refills | Status: AC

## 2017-02-19 MED ORDER — CLOTRIMAZOLE-BETAMETHASONE 1-0.05 % EX CREA
TOPICAL_CREAM | CUTANEOUS | 1 refills | Status: DC
Start: 2017-02-19 — End: 2018-01-23

## 2017-02-19 NOTE — ED Triage Notes (Signed)
Pt reports abscess to left inner thigh, noticed 2 days ago. Vs stable.

## 2017-02-19 NOTE — ED Notes (Addendum)
Pt presents today with absess on the Left inner thigh. Pt has hx of abscess, pt has states the last one was mth ago. Pt states it is very painful and itch this time previous ones have not itched. Pt has been getting them 2 years or so, pt states not seen provider for it. Pt states last period was 2 days ago, but is having discharge that has been there for over a mth now. Pt is A/o.

## 2017-02-19 NOTE — ED Provider Notes (Signed)
[facility] Emergency Department Provider Note  ____________________________________________  Time seen: Approximately 7:52 PM  I have reviewed the triage vital signs and the nursing notes.   HISTORY  Chief Complaint Abscess    HPI Teresa Lowe is a 26 y.o. female that presents to the emergency department for evaluation of dysuria, vaginal discharge and itching to left groin.  Patient has had dysuria for 2 months.  She was given a prescription for antibiotics for an infection but someone stole her antibiotics so she never finished them. She has also had clear vaginal discharge for 1 month.  She states that there is so much discharge that it soaks through her underwear.  She is also having itching to her left groin, and she has noticed some scaling.  It is not painful.  She has been sexually active with the same partner for 2 years. No fever, chills, nausea, vomiting, abdominal pain.    Past Medical History:  Diagnosis Date  . Drug abuse (HCC)   . Gallstones   . Kidney disease   . Renal insufficiency     Patient Active Problem List   Diagnosis Date Noted  . Drug abuse (HCC) 11/01/2016  . Heroin addiction (HCC) 11/01/2016  . Closed displaced transverse fracture of acetabulum (HCC) 10/02/2015  . Noncompliance 10/02/2015    Past Surgical History:  Procedure Laterality Date  . CESAREAN SECTION    . FRACTURE SURGERY     left hip  . JOINT REPLACEMENT     left    Prior to Admission medications   Medication Sig Start Date End Date Taking? Authorizing Provider  clotrimazole-betamethasone (LOTRISONE) cream Apply to affected area 2 times daily 02/19/17 02/19/18  Enid DerryWagner, Rie, PA-C  cyclobenzaprine (FLEXERIL) 5 MG tablet Take 1 tablet (5 mg total) by mouth 3 (three) times daily as needed for muscle spasms. 09/22/16   Menshew, Charlesetta IvoryJenise V Bacon, PA-C  diclofenac (VOLTAREN) 75 MG EC tablet Take 1 tablet (75 mg total) by mouth 2 (two) times daily. 09/22/16    Menshew, Charlesetta IvoryJenise V Bacon, PA-C  ibuprofen (ADVIL,MOTRIN) 600 MG tablet Take 1 tablet (600 mg total) by mouth every 8 (eight) hours as needed. 02/01/17   Joni ReiningSmith, Ronald K, PA-C  metroNIDAZOLE (FLAGYL) 500 MG tablet Take 1 tablet (500 mg total) 2 (two) times daily for 7 days by mouth. 02/19/17 02/26/17  Enid DerryWagner, Helene, PA-C  ondansetron (ZOFRAN ODT) 8 MG disintegrating tablet Take 1 tablet (8 mg total) by mouth every 8 (eight) hours as needed for nausea or vomiting. 01/06/17   Little, Traci M, PA-C  oxyCODONE-acetaminophen (ROXICET) 5-325 MG tablet Take 1 tablet by mouth every 6 (six) hours as needed for moderate pain. 02/01/17   Joni ReiningSmith, Ronald K, PA-C  SUBOXONE 8-2 MG FILM Place 2 Film under the tongue daily. 08/16/16   [provider]  sulfamethoxazole-trimethoprim (BACTRIM DS,SEPTRA DS) 800-160 MG tablet Take 1 tablet 2 (two) times daily by mouth. 02/19/17   Enid DerryWagner, Terria, PA-C  Vitamin D, Ergocalciferol, (DRISDOL) 50000 units CAPS capsule Take 1 capsule (50,000 Units total) by mouth every 7 (seven) days. 05/26/16   Gunnar BullaLawhorn, Jenkins Michelle, CNM    Allergies Patient has no known allergies.  Family History  Problem Relation Age of Onset  . Breast cancer Paternal Grandmother   . Diabetes Paternal Grandmother   . Ovarian cancer Neg Hx   . Colon cancer Neg Hx     Social History Social History   Tobacco Use  . Smoking status: Current Every Day  Smoker    Packs/day: 1.00    Types: Cigarettes    Last attempt to quit: 11/15/2016    Years since quitting: 0.2  . Smokeless tobacco: Never Used  Substance Use Topics  . Alcohol use: No  . Drug use: No     Review of Systems  Constitutional: No fever/chills Cardiovascular: No chest pain. Respiratory:  No SOB. Gastrointestinal: No abdominal pain.  No nausea, no vomiting.  Musculoskeletal: Negative for musculoskeletal pain. Skin: Negative for abrasions, lacerations,  ecchymosis.   ____________________________________________   PHYSICAL EXAM:  VITAL SIGNS: ED Triage Vitals  Enc Vitals Group     BP 02/19/17 1858 (!) 110/59     Pulse Rate 02/19/17 1858 64     Resp 02/19/17 1858 16     Temp 02/19/17 1858 98.2 F (36.8 C)     Temp Source 02/19/17 1858 Oral     SpO2 02/19/17 1858 100 %     Weight 02/19/17 1855 180 lb (81.6 kg)     Height 02/19/17 1855 5\' 4"  (1.626 m)     Head Circumference --      Peak Flow --      Pain Score --      Pain Loc --      Pain Edu? --      Excl. in GC? --      Constitutional: Alert and oriented. Well appearing and in no acute distress. Eyes: Conjunctivae are normal. PERRL. EOMI. Head: Atraumatic. ENT:      Ears:      Nose: No congestion/rhinnorhea.      Mouth/Throat: Mucous membranes are moist.  Neck: No stridor.   Cardiovascular: Normal rate, regular rhythm.  Good peripheral circulation. Respiratory: Normal respiratory effort without tachypnea or retractions. Lungs CTAB. Good air entry to the bases with no decreased or absent breath sounds. Gastrointestinal: Bowel sounds 4 quadrants. Soft and nontender to palpation. No guarding or rigidity. No palpable masses. No distention. No CVA tenderness. Genitourinary: Scaling and flaking to left groin.  No tenderness to palpation.  No erythema.  No external lesions.  No vaginal discharge.  No cervical motion tenderness. Musculoskeletal: Full range of motion to all extremities. No gross deformities appreciated. Neurologic:  Normal speech and language. No gross focal neurologic deficits are appreciated.  Skin:  Skin is warm, dry and intact.    ____________________________________________   LABS (all labs ordered are listed, but only abnormal results are displayed)  Labs Reviewed  WET PREP, GENITAL - Abnormal; Notable for the following components:      Result Value   Clue Cells Wet Prep HPF POC PRESENT (*)    WBC, Wet Prep HPF POC RARE (*)    All other  components within normal limits  URINALYSIS, COMPLETE (UACMP) WITH MICROSCOPIC - Abnormal; Notable for the following components:   Color, Urine YELLOW (*)    APPearance HAZY (*)    Hgb urine dipstick MODERATE (*)    Protein, ur 30 (*)    Leukocytes, UA MODERATE (*)    Bacteria, UA RARE (*)    Squamous Epithelial / LPF 0-5 (*)    All other components within normal limits  CHLAMYDIA/NGC RT PCR ([facility] ONLY)   ____________________________________________  EKG   ____________________________________________  RADIOLOGY  No results found.  ____________________________________________    PROCEDURES  Procedure(s) performed:    Procedures    Medications - No data to display   ____________________________________________   INITIAL IMPRESSION / ASSESSMENT AND PLAN / ED COURSE  Pertinent labs &  imaging results that were available during my care of the patient were reviewed by me and considered in my medical decision making (see chart for details).  Review of the [place] CSRS was performed in accordance of the [facility] prior to dispensing any controlled drugs.  Patient's diagnosis is consistent with cystitis, bacterial vaginosis, intertrigo.  Vital signs and exam are reassuring.  Urinalysis consistent with infection.  Patient has had symptoms for 2 months.  She has been prescribed Keflex and Macrobid and has started both prescriptions but never finished them.  She had a CT scan done 2 months ago and was negative for kidney stone.  Wet prep consistent with BV.  Gonorrhea and chlamydia are negative. Skin symptoms are consistent with intertrigo.  No abscess.  Patient will be discharged home with prescriptions for Bactrim, Flagyl, clotrimazole. Patient is to follow up with PCP as directed. Patient is given ED precautions to return to the ED for any worsening or new symptoms.     ____________________________________________   FINAL CLINICAL IMPRESSION(S) / ED DIAGNOSES  Final  diagnoses:  Intertrigo  Bacterial vaginosis  Cystitis      NEW MEDICATIONS STARTED DURING THIS VISIT:      This chart was dictated using voice recognition software/Dragon. Despite best efforts to proofread, errors can occur which can change the meaning. Any change was purely unintentional. She   Enid DerryWagner, January, PA-C 02/19/17 2228    Sharyn CreamerQuale, Mark, MD 02/20/17 [phone]

## 2017-02-19 NOTE — Discharge Instructions (Signed)
Gonorrhea and Chlamydia tests are still pending. You will be called if they are positive.  °

## 2017-04-18 ENCOUNTER — Telehealth: Payer: Self-pay | Admitting: *Deleted

## 2017-04-18 DIAGNOSIS — N979 Female infertility, unspecified: Secondary | ICD-10-CM

## 2017-04-18 DIAGNOSIS — F1991 Other psychoactive substance use, unspecified, in remission: Secondary | ICD-10-CM

## 2017-04-18 DIAGNOSIS — Z87898 Personal history of other specified conditions: Secondary | ICD-10-CM

## 2017-04-18 NOTE — Telephone Encounter (Signed)
Patient states that she seen Marcelino DusterMichelle for a preconception appt. She states that she was suppose to come back in 6 months to start a pill to get her fertile. I looked at her note and it states to come back for 6 month lab work. I am unsure if she needs a lab visit appt or an actual OV appt.Patient is also requesting a call back. Her contact # is [phone] Please advise,. Thank you

## 2017-04-24 ENCOUNTER — Telehealth: Payer: Self-pay

## 2017-04-24 NOTE — Telephone Encounter (Signed)
Spoke with pt- she states she has been tracking her cycles and states she is not ovulating. Providers instructions given and pt will call the office to schedule a lab appointment.

## 2017-04-25 ENCOUNTER — Other Ambulatory Visit: Payer: Self-pay

## 2017-04-25 DIAGNOSIS — Z87898 Personal history of other specified conditions: Secondary | ICD-10-CM

## 2017-04-25 DIAGNOSIS — F1991 Other psychoactive substance use, unspecified, in remission: Secondary | ICD-10-CM

## 2017-04-25 DIAGNOSIS — N979 Female infertility, unspecified: Secondary | ICD-10-CM

## 2017-04-25 NOTE — Telephone Encounter (Signed)
Orders placed. Please encourage pt to activate MyChart. Thanks, JML

## 2017-04-26 LAB — THYROID PANEL WITH TSH
Free Thyroxine Index: 2.1 (ref 1.2–4.9)
T3 Uptake Ratio: 29 % (ref 24–39)
T4, Total: 7.2 ug/dL (ref 4.5–12.0)
TSH: 1.28 u[IU]/mL (ref 0.450–4.500)

## 2017-04-26 LAB — TESTOSTERONE, FREE, TOTAL, SHBG
SEX HORMONE BINDING: 52.1 nmol/L (ref 24.6–122.0)
TESTOSTERONE: 24 ng/dL (ref 8–48)
Testosterone, Free: 1.4 pg/mL (ref 0.0–4.2)

## 2017-04-26 LAB — FSH/LH
FSH: 5.4 m[IU]/mL
LH: 8.5 m[IU]/mL

## 2017-04-26 LAB — ESTRADIOL: ESTRADIOL: 77.3 pg/mL

## 2017-04-26 LAB — PROLACTIN: Prolactin: 34.3 ng/mL — ABNORMAL HIGH (ref 4.8–23.3)

## 2017-04-26 LAB — PROGESTERONE: PROGESTERONE: 3 ng/mL

## 2017-04-27 ENCOUNTER — Ambulatory Visit (INDEPENDENT_AMBULATORY_CARE_PROVIDER_SITE_OTHER): Payer: Self-pay

## 2017-04-27 DIAGNOSIS — N979 Female infertility, unspecified: Secondary | ICD-10-CM

## 2017-04-29 LAB — MONITOR DRUG PROFILE 14(MW)
Amphetamine Scrn, Ur: NEGATIVE ng/mL
BARBITURATE SCREEN URINE: NEGATIVE ng/mL
BENZODIAZEPINE SCREEN, URINE: NEGATIVE ng/mL
CANNABINOIDS UR QL SCN: NEGATIVE ng/mL
Cocaine (Metab) Scrn, Ur: NEGATIVE ng/mL
Creatinine(Crt), U: 167 mg/dL (ref 20.0–300.0)
Fentanyl, Urine: NEGATIVE pg/mL
METHADONE SCREEN, URINE: NEGATIVE ng/mL
Meperidine Screen, Urine: NEGATIVE ng/mL
OXYCODONE+OXYMORPHONE UR QL SCN: NEGATIVE ng/mL
Opiate Scrn, Ur: NEGATIVE ng/mL
PH UR, DRUG SCRN: 8.6 (ref 4.5–8.9)
PHENCYCLIDINE QUANTITATIVE URINE: NEGATIVE ng/mL
Propoxyphene Scrn, Ur: NEGATIVE ng/mL
SPECIFIC GRAVITY: 1.026
Tramadol Screen, Urine: NEGATIVE ng/mL

## 2017-04-29 LAB — BUPRENORPHINE CONFIRM, URINE
BUPRENORPHINE CONFIRM: 98 ng/mL
BUPRENORPHINE: POSITIVE — AB
BUPRENORPHINE: POSITIVE — AB
Norbuprenorphine Confirm: 78 ng/mL
Norbuprenorphine: POSITIVE — AB

## 2017-04-30 ENCOUNTER — Encounter: Payer: Self-pay | Admitting: Certified Nurse Midwife

## 2017-04-30 ENCOUNTER — Other Ambulatory Visit: Payer: Self-pay | Admitting: Certified Nurse Midwife

## 2017-04-30 DIAGNOSIS — R7989 Other specified abnormal findings of blood chemistry: Secondary | ICD-10-CM

## 2017-04-30 DIAGNOSIS — E229 Hyperfunction of pituitary gland, unspecified: Secondary | ICD-10-CM

## 2017-04-30 DIAGNOSIS — N979 Female infertility, unspecified: Secondary | ICD-10-CM

## 2017-05-01 ENCOUNTER — Telehealth: Payer: Self-pay | Admitting: Certified Nurse Midwife

## 2017-05-01 NOTE — Telephone Encounter (Signed)
The patient called and stated she needed to speak with Marcelino DusterMichelle, I informed the patient that Marcelino DusterMichelle is not here today, and asked the patients name. The patient yelled her name and also stated that "Marcelino DusterMichelle was supposed to give me my results today". I pulled up the patients chart and told her that I could have a nurse call her as soon as they can. The patient yelled "Write this fax number down and send me my stuff". I informed the patient that we can not disclose her information  In that way, She will have to complete a MRR form, and then her information can be disclosed to her. No other information was disclosed. Please advise.

## 2017-05-01 NOTE — Telephone Encounter (Signed)
Pt states she was aware of her lab results. She  told Marcelino DusterMichelle at her last visit  that she was on subtex and Marcelino DusterMichelle said that was ok. She is aware her referral has been placed to endocrinology. Pt will call back if no appt with endocrinology in 2 weeks. [facility] is seeing [facility] tomorrow. She request her labs sent over. Advised [facility] should be able to see labs in care everywhere. If they are unable to view they can call and we will be glad to fax them over. Pt voices understanding.

## 2017-05-06 ENCOUNTER — Encounter: Payer: Self-pay | Admitting: Certified Nurse Midwife

## 2017-05-29 ENCOUNTER — Emergency Department
Admission: EM | Admit: 2017-05-29 | Discharge: 2017-05-29 | Disposition: A | Discharge status: Home / Self Care | Payer: Self-pay | Attending: Emergency Medicine | Admitting: Emergency Medicine

## 2017-05-29 ENCOUNTER — Encounter: Payer: Self-pay | Admitting: Emergency Medicine

## 2017-05-29 DIAGNOSIS — L0291 Cutaneous abscess, unspecified: Secondary | ICD-10-CM

## 2017-05-29 DIAGNOSIS — F17210 Nicotine dependence, cigarettes, uncomplicated: Secondary | ICD-10-CM | POA: Insufficient documentation

## 2017-05-29 DIAGNOSIS — L02214 Cutaneous abscess of groin: Secondary | ICD-10-CM | POA: Insufficient documentation

## 2017-05-29 DIAGNOSIS — Z9660 Presence of unspecified orthopedic joint implant: Secondary | ICD-10-CM | POA: Insufficient documentation

## 2017-05-29 DIAGNOSIS — Z79899 Other long term (current) drug therapy: Secondary | ICD-10-CM | POA: Insufficient documentation

## 2017-05-29 HISTORY — DX: Corpus luteum cyst of ovary, unspecified side: N83.10

## 2017-05-29 MED ORDER — SULFAMETHOXAZOLE-TRIMETHOPRIM 800-160 MG PO TABS
1.0000 | ORAL_TABLET | Freq: Two times a day (BID) | ORAL | 0 refills | Status: DC
Start: 2017-05-29 — End: 2018-01-23

## 2017-05-29 MED ORDER — NAPROXEN 500 MG PO TABS: 500.0000 mg | ORAL_TABLET | Freq: Two times a day (BID) | ORAL | 0 refills | Status: DC

## 2017-05-29 MED ORDER — TRAMADOL HCL 50 MG PO TABS: 50.0000 mg | ORAL_TABLET | Freq: Four times a day (QID) | ORAL | 0 refills | Status: DC | PRN

## 2017-05-29 NOTE — ED Triage Notes (Signed)
Pt stating that she has a "cyst" on her right inner thigh. Pt stating that it has been there for 2 to 3 weeks. Pt stating she has a history of these boils. Pt in NAD

## 2017-05-29 NOTE — ED Provider Notes (Signed)
[facility] Emergency Department Provider Note  ____________________________________________  Time seen: Approximately 3:14 PM  I have reviewed the triage vital signs and the nursing notes.   HISTORY  Chief Complaint Abscess   HPI Teresa Lowe is a 27 y.o. female who presents to the emergency department for evaluation and treatment of a tender area at the skin fold of her right groin area.  Patient states that she has had a history of boils, but typically they "pop on their own."  Patient states that this 1 is deeper into the skin and has been present for the past 2 weeks.  She denies fever or other symptoms of concern.  Past Medical History:  Diagnosis Date  . Cyst, ovary theca lutein    right  . Drug abuse (HCC)   . Gallstones   . Kidney disease   . Renal insufficiency     Patient Active Problem List   Diagnosis Date Noted  . Elevated prolactin level (HCC) 04/30/2017  . Drug abuse (HCC) 11/01/2016  . Heroin addiction (HCC) 11/01/2016  . Closed displaced transverse fracture of acetabulum (HCC) 10/02/2015  . Noncompliance 10/02/2015    Past Surgical History:  Procedure Laterality Date  . CESAREAN SECTION    . FRACTURE SURGERY     left hip  . JOINT REPLACEMENT     left    Prior to Admission medications   Medication Sig Start Date End Date Taking? Authorizing Provider  clotrimazole-betamethasone (LOTRISONE) cream Apply to affected area 2 times daily 02/19/17 02/19/18  Enid DerryWagner, Blanch, PA-C  cyclobenzaprine (FLEXERIL) 5 MG tablet Take 1 tablet (5 mg total) by mouth 3 (three) times daily as needed for muscle spasms. 09/22/16   Menshew, Charlesetta IvoryJenise V Bacon, PA-C  diclofenac (VOLTAREN) 75 MG EC tablet Take 1 tablet (75 mg total) by mouth 2 (two) times daily. 09/22/16   Menshew, Charlesetta IvoryJenise V Bacon, PA-C  ibuprofen (ADVIL,MOTRIN) 600 MG tablet Take 1 tablet (600 mg total) by mouth every 8 (eight) hours as needed. 02/01/17   Joni ReiningSmith, Ronald K, PA-C  naproxen  (NAPROSYN) 500 MG tablet Take 1 tablet (500 mg total) by mouth 2 (two) times daily with a meal. 05/29/17   Carolyn Sylvia B, FNP  ondansetron (ZOFRAN ODT) 8 MG disintegrating tablet Take 1 tablet (8 mg total) by mouth every 8 (eight) hours as needed for nausea or vomiting. 01/06/17   Little, Traci M, PA-C  oxyCODONE-acetaminophen (ROXICET) 5-325 MG tablet Take 1 tablet by mouth every 6 (six) hours as needed for moderate pain. 02/01/17   Joni ReiningSmith, Ronald K, PA-C  SUBOXONE 8-2 MG FILM Place 2 Film under the tongue daily. 08/16/16   [provider]  sulfamethoxazole-trimethoprim (BACTRIM DS,SEPTRA DS) 800-160 MG tablet Take 1 tablet by mouth 2 (two) times daily. 05/29/17   Aayden Cefalu, Rulon Eisenmengerari B, FNP  traMADol (ULTRAM) 50 MG tablet Take 1 tablet (50 mg total) by mouth every 6 (six) hours as needed. 05/29/17   Danaysha Kirn, Rulon Eisenmengerari B, FNP  Vitamin D, Ergocalciferol, (DRISDOL) 50000 units CAPS capsule Take 1 capsule (50,000 Units total) by mouth every 7 (seven) days. 05/26/16   Gunnar BullaLawhorn, Jenkins Michelle, CNM    Allergies Patient has no known allergies.  Family History  Problem Relation Age of Onset  . Breast cancer Paternal Grandmother   . Diabetes Paternal Grandmother   . Ovarian cancer Neg Hx   . Colon cancer Neg Hx     Social History Social History   Tobacco Use  . Smoking status: Current Every  Day Smoker    Packs/day: 1.00    Types: Cigarettes    Last attempt to quit: 11/15/2016    Years since quitting: 0.5  . Smokeless tobacco: Never Used  Substance Use Topics  . Alcohol use: No  . Drug use: No    Review of Systems  Constitutional: Negative for fever. Respiratory: Negative for cough or shortness of breath.  Musculoskeletal: Negative for myalgias Skin: Positive for lesion in the right groin Neurological: Negative for numbness or paresthesias. ____________________________________________   PHYSICAL EXAM:  VITAL SIGNS: ED Triage Vitals  Enc Vitals Group     BP 05/29/17 1458 (!) 118/58      Pulse Rate 05/29/17 1458 82     Resp 05/29/17 1458 18     Temp 05/29/17 1458 98.3 F (36.8 C)     Temp Source 05/29/17 1458 Oral     SpO2 05/29/17 1458 98 %     Weight 05/29/17 1459 185 lb 9.6 oz (84.2 kg)     Height 05/29/17 1459 5\' 4"  (1.626 m)     Head Circumference --      Peak Flow --      Pain Score 05/29/17 1458 8     Pain Loc --      Pain Edu? --      Excl. in GC? --      Constitutional: Well appearing. Eyes: Conjunctivae are clear without discharge or drainage. Nose: No rhinorrhea noted. Mouth/Throat: Airway is patent.  Neck: No stridor. Unrestricted range of motion observed. Lymphatic: No palpable, tender inguinal nodes on exam Cardiovascular: Capillary refill is <3 seconds.  Respiratory: Respirations are even and unlabored.. Musculoskeletal: Unrestricted range of motion observed. Neurologic: Awake, alert, and oriented x 4.  Skin: Tender area in the skin fold of the right groin area near the labia majora without erythema, induration or fluctuance.   ____________________________________________   LABS (all labs ordered are listed, but only abnormal results are displayed)  Labs Reviewed - No data to display ____________________________________________  EKG  Not indicated ____________________________________________  RADIOLOGY  Not indicated ____________________________________________   PROCEDURES  Procedures ____________________________________________   INITIAL IMPRESSION / ASSESSMENT AND PLAN / ED COURSE  Teresa Lowe is a 27 y.o. female who presents to the emergency department for evaluation and treatment of a lesion in the skin fold on the right groin.  Lesion is tender, but is not erythematous, indurated, or fluctuant.  Antibiotic trial will be started today and she will be given tramadol for pain.  She was instructed to return to the emergency department for symptoms of change or worsen if she is unable to schedule appointment with the  primary care provider for choice.  Medications - No data to display   Pertinent labs & imaging results that were available during my care of the patient were reviewed by me and considered in my medical decision making (see chart for details). ____________________________________________   FINAL CLINICAL IMPRESSION(S) / ED DIAGNOSES  Final diagnoses:  Abscess    ED Discharge Orders        Ordered    sulfamethoxazole-trimethoprim (BACTRIM DS,SEPTRA DS) 800-160 MG tablet  2 times daily     05/29/17 1546    traMADol (ULTRAM) 50 MG tablet  Every 6 hours PRN     05/29/17 1546    naproxen (NAPROSYN) 500 MG tablet  2 times daily with meals     05/29/17 1546       Note:  This document was prepared using Dragon voice recognition  software and may include unintentional dictation errors.    Chinita Pester, FNP 05/29/17 1626    Emily Filbert, MD 05/30/17 [phone]

## 2017-05-29 NOTE — Discharge Instructions (Signed)
Use a warm compress on the area 4 times per day.  Return to the ER for symptoms of concern if unable to schedule an appointment with primary care.

## 2017-09-14 ENCOUNTER — Emergency Department
Admission: EM | Admit: 2017-09-14 | Discharge: 2017-09-14 | Disposition: A | Discharge status: Home / Self Care | Payer: Self-pay | Attending: Emergency Medicine | Admitting: Emergency Medicine

## 2017-09-14 ENCOUNTER — Encounter: Payer: Self-pay | Admitting: Emergency Medicine

## 2017-09-14 DIAGNOSIS — Z5321 Procedure and treatment not carried out due to patient leaving prior to being seen by health care provider: Secondary | ICD-10-CM | POA: Insufficient documentation

## 2017-09-14 DIAGNOSIS — R2232 Localized swelling, mass and lump, left upper limb: Secondary | ICD-10-CM | POA: Insufficient documentation

## 2017-09-14 NOTE — ED Notes (Signed)
Called from major wait   No answer

## 2017-09-14 NOTE — ED Notes (Signed)
Called from wait   No answer

## 2017-09-14 NOTE — ED Notes (Signed)
Called for room placement. No answer

## 2017-09-14 NOTE — ED Triage Notes (Signed)
Pt comes into the ED via POV c/o possible abscess under her left axilla.  Patient states it showed up 2 days ago with swelling and redness.  Patient in NAD at this time.

## 2017-09-19 ENCOUNTER — Other Ambulatory Visit: Payer: Self-pay

## 2017-09-19 ENCOUNTER — Encounter: Payer: Self-pay | Admitting: Emergency Medicine

## 2017-09-19 ENCOUNTER — Emergency Department: Payer: Self-pay

## 2017-09-19 ENCOUNTER — Emergency Department
Admission: EM | Admit: 2017-09-19 | Discharge: 2017-09-19 | Discharge status: Against Medical Advice | Payer: Self-pay | Attending: Emergency Medicine | Admitting: Emergency Medicine

## 2017-09-19 DIAGNOSIS — M7989 Other specified soft tissue disorders: Secondary | ICD-10-CM

## 2017-09-19 DIAGNOSIS — F1910 Other psychoactive substance abuse, uncomplicated: Secondary | ICD-10-CM | POA: Insufficient documentation

## 2017-09-19 DIAGNOSIS — F1990 Other psychoactive substance use, unspecified, uncomplicated: Secondary | ICD-10-CM

## 2017-09-19 DIAGNOSIS — M79602 Pain in left arm: Secondary | ICD-10-CM

## 2017-09-19 DIAGNOSIS — F17210 Nicotine dependence, cigarettes, uncomplicated: Secondary | ICD-10-CM | POA: Insufficient documentation

## 2017-09-19 DIAGNOSIS — M79622 Pain in left upper arm: Secondary | ICD-10-CM | POA: Insufficient documentation

## 2017-09-19 LAB — CBC WITH DIFFERENTIAL/PLATELET
BASOS PCT: 1 %
Basophils Absolute: 0.1 10*3/uL (ref 0–0.1)
Eosinophils Absolute: 0.2 10*3/uL (ref 0–0.7)
Eosinophils Relative: 3 %
HEMATOCRIT: 42.7 % (ref 35.0–47.0)
HEMOGLOBIN: 14.8 g/dL (ref 12.0–16.0)
LYMPHS ABS: 2.1 10*3/uL (ref 1.0–3.6)
Lymphocytes Relative: 24 %
MCH: 29.9 pg (ref 26.0–34.0)
MCHC: 34.7 g/dL (ref 32.0–36.0)
MCV: 86 fL (ref 80.0–100.0)
MONO ABS: 0.7 10*3/uL (ref 0.2–0.9)
MONOS PCT: 8 %
NEUTROS ABS: 5.7 10*3/uL (ref 1.4–6.5)
Neutrophils Relative %: 64 %
Platelets: 338 10*3/uL (ref 150–440)
RBC: 4.96 MIL/uL (ref 3.80–5.20)
RDW: 14.5 % (ref 11.5–14.5)
WBC: 8.8 10*3/uL (ref 3.6–11.0)

## 2017-09-19 LAB — COMPREHENSIVE METABOLIC PANEL
ALBUMIN: 4.2 g/dL (ref 3.5–5.0)
ALK PHOS: 74 U/L (ref 38–126)
ALT: 19 U/L (ref 14–54)
ANION GAP: 9 (ref 5–15)
AST: 17 U/L (ref 15–41)
BILIRUBIN TOTAL: 0.4 mg/dL (ref 0.3–1.2)
BUN: 14 mg/dL (ref 6–20)
CALCIUM: 9 mg/dL (ref 8.9–10.3)
CO2: 22 mmol/L (ref 22–32)
Chloride: 105 mmol/L (ref 101–111)
Creatinine, Ser: 0.48 mg/dL (ref 0.44–1.00)
GLUCOSE: 102 mg/dL — AB (ref 65–99)
POTASSIUM: 3.9 mmol/L (ref 3.5–5.1)
Sodium: 136 mmol/L (ref 135–145)
TOTAL PROTEIN: 8.1 g/dL (ref 6.5–8.1)

## 2017-09-19 MED ORDER — OXYCODONE-ACETAMINOPHEN 5-325 MG PO TABS
1.0000 | ORAL_TABLET | ORAL | Status: DC | PRN
  Administered 2017-09-19: 1 via ORAL
  Filled 2017-09-19: qty 1

## 2017-09-19 NOTE — ED Notes (Signed)
This tech drew pt blood and cultures and even

## 2017-09-19 NOTE — ED Provider Notes (Signed)
[facility] Emergency Department Provider Note  ____________________________________________   First MD Initiated Contact with Patient 09/19/17 [phone]     (approximate)  I have reviewed the triage vital signs and the nursing notes.   HISTORY  Chief Complaint Abscess    HPI Teresa Lowe is a 27 y.o. female resents emergency department complaining about swelling in the left axilla.  Patient states she also has an abscess on her labia.  She is a known drug abuser.  She admits to injecting heroin in the same arm less than 2 days ago.  She denies any fever or chills.  Patient is asking for pain medication.  Past Medical History:  Diagnosis Date  . Cyst, ovary theca lutein    right  . Drug abuse (HCC)   . Gallstones   . Kidney disease   . Renal insufficiency     Patient Active Problem List   Diagnosis Date Noted  . Elevated prolactin level (HCC) 04/30/2017  . Drug abuse (HCC) 11/01/2016  . Heroin addiction (HCC) 11/01/2016  . Closed displaced transverse fracture of acetabulum (HCC) 10/02/2015  . Noncompliance 10/02/2015    Past Surgical History:  Procedure Laterality Date  . CESAREAN SECTION    . FRACTURE SURGERY     left hip  . JOINT REPLACEMENT     left    Prior to Admission medications   Medication Sig Start Date End Date Taking? Authorizing Provider  clotrimazole-betamethasone (LOTRISONE) cream Apply to affected area 2 times daily 02/19/17 02/19/18  Enid Derry, PA-C  cyclobenzaprine (FLEXERIL) 5 MG tablet Take 1 tablet (5 mg total) by mouth 3 (three) times daily as needed for muscle spasms. 09/22/16   Menshew, Charlesetta Ivory, PA-C  diclofenac (VOLTAREN) 75 MG EC tablet Take 1 tablet (75 mg total) by mouth 2 (two) times daily. 09/22/16   Menshew, Charlesetta Ivory, PA-C  ibuprofen (ADVIL,MOTRIN) 600 MG tablet Take 1 tablet (600 mg total) by mouth every 8 (eight) hours as needed. 02/01/17   Joni Reining, PA-C  naproxen (NAPROSYN) 500 MG  tablet Take 1 tablet (500 mg total) by mouth 2 (two) times daily with a meal. 05/29/17   Triplett, Cari B, FNP  ondansetron (ZOFRAN ODT) 8 MG disintegrating tablet Take 1 tablet (8 mg total) by mouth every 8 (eight) hours as needed for nausea or vomiting. 01/06/17   Little, Traci M, PA-C  oxyCODONE-acetaminophen (ROXICET) 5-325 MG tablet Take 1 tablet by mouth every 6 (six) hours as needed for moderate pain. 02/01/17   Joni Reining, PA-C  SUBOXONE 8-2 MG FILM Place 2 Film under the tongue daily. 08/16/16   [provider]  sulfamethoxazole-trimethoprim (BACTRIM DS,SEPTRA DS) 800-160 MG tablet Take 1 tablet by mouth 2 (two) times daily. 05/29/17   Triplett, Rulon Eisenmenger B, FNP  traMADol (ULTRAM) 50 MG tablet Take 1 tablet (50 mg total) by mouth every 6 (six) hours as needed. 05/29/17   Triplett, Rulon Eisenmenger B, FNP  Vitamin D, Ergocalciferol, (DRISDOL) 50000 units CAPS capsule Take 1 capsule (50,000 Units total) by mouth every 7 (seven) days. 05/26/16   Gunnar Bulla, CNM    Allergies Patient has no known allergies.  Family History  Problem Relation Age of Onset  . Breast cancer Paternal Grandmother   . Diabetes Paternal Grandmother   . Ovarian cancer Neg Hx   . Colon cancer Neg Hx     Social History Social History   Tobacco Use  . Smoking status: Current Every Day Smoker  Packs/day: 1.00    Types: Cigarettes    Last attempt to quit: 11/15/2016    Years since quitting: 0.8  . Smokeless tobacco: Never Used  Substance Use Topics  . Alcohol use: No  . Drug use: No    Review of Systems  Constitutional: No fever/chills Eyes: No visual changes. ENT: No sore throat. Respiratory: Denies cough Cardiovascular: States she is having some pain that radiates into the chest from the left axilla Genitourinary: Negative for dysuria. Musculoskeletal: Negative for back pain. Skin: Negative for rash.  Positive for swelling of the left  axilla    ____________________________________________   PHYSICAL EXAM:  VITAL SIGNS: ED Triage Vitals  Enc Vitals Group     BP 09/19/17 0627 129/72     Pulse Rate 09/19/17 0627 83     Resp 09/19/17 0627 18     Temp 09/19/17 0627 98.6 F (37 C)     Temp Source 09/19/17 0627 Oral     SpO2 09/19/17 0627 98 %     Weight 09/19/17 0626 180 lb (81.6 kg)     Height 09/19/17 0626 5\' 4"  (1.626 m)     Head Circumference --      Peak Flow --      Pain Score 09/19/17 0626 10     Pain Loc --      Pain Edu? --      Excl. in GC? --     Constitutional: Alert and oriented. Well appearing and in no acute distress. Eyes: Conjunctivae are normal.  Head: Atraumatic. Nose: Active congestion/rhinnorhea. Mouth/Throat: Mucous membranes are moist.   Neck: Supple, no lymphadenopathy is noted at the cervical aspect, there is a large swollen area in the left axilla Cardiovascular: Normal rate, regular rhythm.  Heart sounds are normal, EKG is normal Respiratory: Normal respiratory effort.  No retractions, lungs clear to auscultation GU: No obvious abscess on the labia. Musculoskeletal: FROM all extremities, warm and well perfused Neurologic:  Normal speech and language.  Skin:  Skin is warm, dry and intact. No rash noted. Psychiatric: Mood and affect are normal. Speech and behavior are normal.  ____________________________________________   LABS (all labs ordered are listed, but only abnormal results are displayed)  Labs Reviewed  COMPREHENSIVE METABOLIC PANEL - Abnormal; Notable for the following components:      Result Value   Glucose, Bld 102 (*)    All other components within normal limits  CULTURE, BLOOD (ROUTINE X 2)  CULTURE, BLOOD (ROUTINE X 2)  CBC WITH DIFFERENTIAL/PLATELET   ____________________________________________   ____________________________________________  RADIOLOGY  Ultrasound of the left upper extremity was ordered was negative for  DVT  ____________________________________________   PROCEDURES  Procedure(s) performed: No  Procedures    ____________________________________________   INITIAL IMPRESSION / ASSESSMENT AND PLAN / ED COURSE  Pertinent labs & imaging results that were available during my care of the patient were reviewed by me and considered in my medical decision making (see chart for details).  Patient is 27 year old female presents emergency department complaining of left axillary pain and an abscess on the labia.  She denies fever or chills at this time.  States the pain in the left axilla radiates into the chest and she said some chest pain.  She admits to being on IV drug user.  She states that she injected heroin less than 2 days ago into the left arm.  There is no redness or swelling on the arm per the patient.  On physical exam the left  arm is not red or swollen, the left axilla is tender and swollen to palpation.  The labia do not show an obvious abscess at this time.  EKG, ultrasound, CBC and metabolic panel ordered, blood cultures x2 were also ordered due to the IV drug use.  The patient will not stay in her room.  She left her room several times to walk up to room 7 to see her friend.  She is antsy and talkative.  Several times the nursing staff has had to go and get her to bring her back for additional testing.  Finally when her friend has been discharged from the main ED she returns to the room with her friend.  At this time it appears that she has been to the parking lot to either inject or snort a type of drug as she appears to be high.  She is once again asking for a prescription for pain medication.  Explained to her at this time I will not be giving her any narcotic pain medication as she has a history of drug abuse and it is my policy to not provide narcotics for this type of illness.  I stated it is not safe for her and will not be responsible for her death.  She then proceeded to yell  at me and tell me that I was the reason she was going to the parking lot to inject heroin because I will not give her narcotics.  Explained to her once again that I will not prescribe narcotics.  She once again started to yell at me and told me that she has understand why we will give her pain pills when her friend was upfront and got pain pills and there is nothing wrong with her.  Splane her once again she is not receiving narcotics and I will be glad to give her the antibiotic that she  needs.  She again started to yell at me and so I called security to have her removed from the premises.     As part of my medical decision making, I reviewed the following data within the electronic MEDICAL RECORD NUMBER Nursing notes reviewed and incorporated, Labs reviewed CBC is normal, comprehensive metabolic is normal, blood cultures are pending, EKG interpreted NSR which was read by Dr. Lenard Lance, Old chart reviewed, Radiograph reviewed ultrasound of left upper extremity shows no DVT or abscess, Notes from prior ED visits and [place] Controlled Substance Database  ____________________________________________   FINAL CLINICAL IMPRESSION(S) / ED DIAGNOSES  Final diagnoses:  Left axillary swelling  Left arm pain  IV drug user      NEW MEDICATIONS STARTED DURING THIS VISIT:  Discharge Medication List as of 09/19/2017 10:26 AM       Note:  This document was prepared using Dragon voice recognition software and may include unintentional dictation errors.    Faythe Ghee, PA-C 09/19/17 1606    Don Perking, [place], MD 09/20/17 1251    Nita Sickle, MD 09/20/17 1252

## 2017-09-19 NOTE — ED Notes (Signed)
See triage note.  States she developed a swollen area under left arm couple of days ago  No redness noted but area is swollen and tender   States she "shot up" in arm yesterday  Also having some discomfort to left side of chest

## 2017-09-19 NOTE — ED Notes (Signed)
This tech and susan, pa assisted pt to ambulate to bed pt needed maxium assistance

## 2017-09-19 NOTE — ED Notes (Signed)
Pt not in room for lab work  States she needed to see her room mate  Encouraged to wait in room for treatment

## 2017-09-19 NOTE — ED Notes (Signed)
[place] [employer] at bedside   She was yelling at provider

## 2017-09-19 NOTE — ED Notes (Signed)
See triage note   Presents with possible abscess areas under left arm and groin  States sx's started about 1 -2 weeks ago   [facility] but states areas are getting worse

## 2017-09-19 NOTE — ED Triage Notes (Addendum)
Patient ambulatory to triage with steady gait, without difficulty or distress noted; pt reports abscess to left axilla and labia x 2months; [facility] but had [facility]&D; came here 6/6 but left before being seen

## 2017-09-19 NOTE — ED Provider Notes (Signed)
-----------------------------------------   9:51 AM on 09/19/2017 -----------------------------------------  EKG reviewed and interpreted by myself shows normal sinus rhythm at 64 bpm, narrow QRS, normal axis, normal intervals, no concerning ST changes.   Minna AntisPaduchowski, Rodriguez Aguinaldo, MD 09/19/17 [phone]

## 2017-09-24 LAB — CULTURE, BLOOD (ROUTINE X 2)
CULTURE: NO GROWTH
Culture: NO GROWTH
Special Requests: ADEQUATE
Special Requests: ADEQUATE

## 2018-01-22 ENCOUNTER — Other Ambulatory Visit: Payer: Self-pay

## 2018-01-22 ENCOUNTER — Encounter: Payer: Self-pay | Admitting: Emergency Medicine

## 2018-01-22 ENCOUNTER — Emergency Department: Payer: Self-pay

## 2018-01-22 ENCOUNTER — Emergency Department
Admission: EM | Admit: 2018-01-22 | Discharge: 2018-01-22 | Discharge status: Against Medical Advice | Payer: Self-pay | Attending: Student in an Organized Health Care Education/Training Program | Admitting: Student in an Organized Health Care Education/Training Program

## 2018-01-22 DIAGNOSIS — N739 Female pelvic inflammatory disease, unspecified: Secondary | ICD-10-CM

## 2018-01-22 DIAGNOSIS — N7093 Salpingitis and oophoritis, unspecified: Secondary | ICD-10-CM

## 2018-01-22 DIAGNOSIS — F17210 Nicotine dependence, cigarettes, uncomplicated: Secondary | ICD-10-CM | POA: Insufficient documentation

## 2018-01-22 LAB — COMPREHENSIVE METABOLIC PANEL
ALT: 11 U/L (ref 0–44)
AST: 13 U/L — AB (ref 15–41)
Albumin: 3.8 g/dL (ref 3.5–5.0)
Alkaline Phosphatase: 68 U/L (ref 38–126)
Anion gap: 7 (ref 5–15)
BUN: 8 mg/dL (ref 6–20)
CHLORIDE: 98 mmol/L (ref 98–111)
CO2: 27 mmol/L (ref 22–32)
Calcium: 9 mg/dL (ref 8.9–10.3)
Creatinine, Ser: 0.54 mg/dL (ref 0.44–1.00)
Glucose, Bld: 107 mg/dL — ABNORMAL HIGH (ref 70–99)
Potassium: 3.5 mmol/L (ref 3.5–5.1)
SODIUM: 132 mmol/L — AB (ref 135–145)
Total Bilirubin: 0.6 mg/dL (ref 0.3–1.2)
Total Protein: 7.3 g/dL (ref 6.5–8.1)

## 2018-01-22 LAB — URINALYSIS, COMPLETE (UACMP) WITH MICROSCOPIC
BILIRUBIN URINE: NEGATIVE
Glucose, UA: NEGATIVE mg/dL
KETONES UR: NEGATIVE mg/dL
NITRITE: POSITIVE — AB
Protein, ur: 100 mg/dL — AB
RBC / HPF: >50 RBC/hpf — ABNORMAL HIGH (ref 0–5)
Specific Gravity, Urine: 1.026 (ref 1.005–1.030)
pH: 6 (ref 5.0–8.0)

## 2018-01-22 LAB — CBC
HEMATOCRIT: 40.7 % (ref 36.0–46.0)
Hemoglobin: 13.9 g/dL (ref 12.0–15.0)
MCH: 29.5 pg (ref 26.0–34.0)
MCHC: 34.2 g/dL (ref 30.0–36.0)
MCV: 86.4 fL (ref 80.0–100.0)
NRBC: 0 % (ref 0.0–0.2)
Platelets: 304 10*3/uL (ref 150–400)
RBC: 4.71 MIL/uL (ref 3.87–5.11)
RDW: 13.7 % (ref 11.5–15.5)
WBC: 22 10*3/uL — AB (ref 4.0–10.5)

## 2018-01-22 LAB — CHLAMYDIA/NGC RT PCR (ARMC ONLY)
CHLAMYDIA TR: NOT DETECTED
Chlamydia Tr: NOT DETECTED
N GONORRHOEAE: NOT DETECTED
N gonorrhoeae: NOT DETECTED

## 2018-01-22 LAB — WET PREP, GENITAL
SPERM: NONE SEEN
Trich, Wet Prep: NONE SEEN
Yeast Wet Prep HPF POC: NONE SEEN

## 2018-01-22 LAB — LIPASE, BLOOD: LIPASE: 21 U/L (ref 11–51)

## 2018-01-22 LAB — POCT PREGNANCY, URINE: PREG TEST UR: NEGATIVE

## 2018-01-22 MED ORDER — SODIUM CHLORIDE 0.9 % IV BOLUS
1000.0000 mL | Freq: Once | INTRAVENOUS | Status: AC
  Administered 2018-01-22: 1000 mL via INTRAVENOUS

## 2018-01-22 MED ORDER — NICOTINE 21 MG/24HR TD PT24: 21.0000 mg | MEDICATED_PATCH | Freq: Once | TRANSDERMAL | Status: DC

## 2018-01-22 MED ORDER — DOXYCYCLINE HYCLATE 100 MG PO TABS
100.0000 mg | ORAL_TABLET | Freq: Two times a day (BID) | ORAL | Status: DC
Start: 2018-01-22 — End: 2018-01-22

## 2018-01-22 MED ORDER — SODIUM CHLORIDE 0.9 % IV SOLN
1.0000 g | Freq: Once | INTRAVENOUS | Status: AC
  Administered 2018-01-22: 1 g via INTRAVENOUS
  Filled 2018-01-22: qty 10

## 2018-01-22 MED ORDER — DOXYCYCLINE HYCLATE 100 MG PO TABS
100.0000 mg | ORAL_TABLET | Freq: Once | ORAL | Status: AC
  Administered 2018-01-22: 100 mg via ORAL
  Filled 2018-01-22: qty 1

## 2018-01-22 MED ORDER — PROMETHAZINE HCL 25 MG/ML IJ SOLN
12.5000 mg | Freq: Four times a day (QID) | INTRAMUSCULAR | Status: DC | PRN
  Administered 2018-01-22: 12.5 mg via INTRAVENOUS
  Filled 2018-01-22: qty 1

## 2018-01-22 MED ORDER — MORPHINE SULFATE (PF) 4 MG/ML IV SOLN
4.0000 mg | INTRAVENOUS | Status: DC | PRN
  Administered 2018-01-22 (×2): 4 mg via INTRAVENOUS
  Filled 2018-01-22 (×2): qty 1

## 2018-01-22 MED ORDER — SODIUM CHLORIDE 0.9 % IV SOLN: 2.0000 g | Freq: Two times a day (BID) | INTRAVENOUS | Status: DC

## 2018-01-22 MED ORDER — IOPAMIDOL (ISOVUE-300) INJECTION 61%
100.0000 mL | Freq: Once | INTRAVENOUS | Status: AC | PRN
  Administered 2018-01-22: 100 mL via INTRAVENOUS
  Filled 2018-01-22: qty 100

## 2018-01-22 NOTE — ED Provider Notes (Addendum)
[facility] Emergency Department Provider Note    First MD Initiated Contact with Patient 01/22/18 1555     (approximate)  I have reviewed the triage vital signs and the nursing notes.   HISTORY  Chief Complaint Flank Pain    HPI Teresa Lowe is a 27 y.o. female with a history of ovarian cyst presents to the ER for evaluation of severe right-sided flank pain and right lower quadrant pain associated with dysuria vaginal discharge and foul smell to her urine.  States that she is having associated nausea and feels like anytime she farts that her "stomach is about to explode".  Has never had pain like this before.  Started earlier this morning.  Does report chills at home.    Past Medical History:  Diagnosis Date  . Cyst, ovary theca lutein    right  . Drug abuse (HCC)   . Gallstones   . Kidney disease   . Renal insufficiency    Family History  Problem Relation Age of Onset  . Breast cancer Paternal Grandmother   . Diabetes Paternal Grandmother   . Ovarian cancer Neg Hx   . Colon cancer Neg Hx    Past Surgical History:  Procedure Laterality Date  . CESAREAN SECTION    . FRACTURE SURGERY     left hip  . JOINT REPLACEMENT     left   Patient Active Problem List   Diagnosis Date Noted  . Elevated prolactin level (HCC) 04/30/2017  . Drug abuse (HCC) 11/01/2016  . Heroin addiction (HCC) 11/01/2016  . Closed displaced transverse fracture of acetabulum (HCC) 10/02/2015  . Noncompliance 10/02/2015      Prior to Admission medications   Medication Sig Start Date End Date Taking? Authorizing Provider  clotrimazole-betamethasone (LOTRISONE) cream Apply to affected area 2 times daily Patient not taking: Reported on 01/22/2018 02/19/17 02/19/18  Enid Derry, PA-C  cyclobenzaprine (FLEXERIL) 5 MG tablet Take 1 tablet (5 mg total) by mouth 3 (three) times daily as needed for muscle spasms. Patient not taking: Reported on 01/22/2018 09/22/16   Menshew,  Charlesetta Ivory, PA-C  diclofenac (VOLTAREN) 75 MG EC tablet Take 1 tablet (75 mg total) by mouth 2 (two) times daily. Patient not taking: Reported on 01/22/2018 09/22/16   Menshew, Charlesetta Ivory, PA-C  ibuprofen (ADVIL,MOTRIN) 600 MG tablet Take 1 tablet (600 mg total) by mouth every 8 (eight) hours as needed. Patient not taking: Reported on 01/22/2018 02/01/17   Joni Reining, PA-C  naproxen (NAPROSYN) 500 MG tablet Take 1 tablet (500 mg total) by mouth 2 (two) times daily with a meal. Patient not taking: Reported on 01/22/2018 05/29/17   Kem Boroughs B, FNP  ondansetron (ZOFRAN ODT) 8 MG disintegrating tablet Take 1 tablet (8 mg total) by mouth every 8 (eight) hours as needed for nausea or vomiting. Patient not taking: Reported on 01/22/2018 01/06/17   Little, Traci M, PA-C  oxyCODONE-acetaminophen (ROXICET) 5-325 MG tablet Take 1 tablet by mouth every 6 (six) hours as needed for moderate pain. Patient not taking: Reported on 01/22/2018 02/01/17   Joni Reining, PA-C  SUBOXONE 8-2 MG FILM Place 2 Film under the tongue daily. 08/16/16   [provider]  sulfamethoxazole-trimethoprim (BACTRIM DS,SEPTRA DS) 800-160 MG tablet Take 1 tablet by mouth 2 (two) times daily. Patient not taking: Reported on 01/22/2018 05/29/17   Kem Boroughs B, FNP  traMADol (ULTRAM) 50 MG tablet Take 1 tablet (50 mg total) by mouth every 6 (  six) hours as needed. Patient not taking: Reported on 01/22/2018 05/29/17   Kem Boroughs B, FNP  Vitamin D, Ergocalciferol, (DRISDOL) 50000 units CAPS capsule Take 1 capsule (50,000 Units total) by mouth every 7 (seven) days. Patient not taking: Reported on 01/22/2018 05/26/16   Gunnar Bulla, CNM    Allergies Patient has no known allergies.    Social History Social History   Tobacco Use  . Smoking status: Current Every Day Smoker    Packs/day: 1.00    Types: Cigarettes    Last attempt to quit: 11/15/2016    Years since quitting: 1.1  . Smokeless  tobacco: Never Used  Substance Use Topics  . Alcohol use: No  . Drug use: No    Review of Systems Patient denies headaches, rhinorrhea, blurry vision, numbness, shortness of breath, chest pain, edema, cough, abdominal pain, nausea, vomiting, diarrhea, dysuria, fevers, rashes or hallucinations unless otherwise stated above in HPI. ____________________________________________   PHYSICAL EXAM:  VITAL SIGNS: Vitals:   01/22/18 1512 01/22/18 1922  BP: 116/66 118/67  Pulse: (!) 112   Resp: 20   Temp: 98.1 F (36.7 C)   SpO2: 100%     Constitutional: Alert and oriented.  Eyes: Conjunctivae are normal.  Head: Atraumatic. Nose: No congestion/rhinnorhea. Mouth/Throat: Mucous membranes are moist.   Neck: No stridor. Painless ROM.  Cardiovascular: Normal rate, regular rhythm. Grossly normal heart sounds.  Good peripheral circulation. Respiratory: Normal respiratory effort.  No retractions. Lungs CTAB. Gastrointestinal: Soft with ttp in RLQ No distention. No abdominal bruits. No CVA tenderness. Genitourinary:  cmt with foul smelling purulent discharge  from cervical os.  + right adnexal ttp Musculoskeletal: No lower extremity tenderness nor edema.  No joint effusions. Neurologic:  Normal speech and language. No gross focal neurologic deficits are appreciated. No facial droop Skin:  Skin is warm, dry and intact. No rash noted. Psychiatric: Mood and affect are normal. Speech and behavior are normal.  ____________________________________________   LABS (all labs ordered are listed, but only abnormal results are displayed)  Results for orders placed or performed during the hospital encounter of 01/22/18 (from the past 24 hour(s))  Urinalysis, Complete w Microscopic     Status: Abnormal   Collection Time: 01/22/18  3:11 PM  Result Value Ref Range   Color, Urine AMBER (A) YELLOW   APPearance CLOUDY (A) CLEAR   Specific Gravity, Urine 1.026 1.005 - 1.030   pH 6.0 5.0 - 8.0   Glucose,  UA NEGATIVE NEGATIVE mg/dL   Hgb urine dipstick LARGE (A) NEGATIVE   Bilirubin Urine NEGATIVE NEGATIVE   Ketones, ur NEGATIVE NEGATIVE mg/dL   Protein, ur 161 (A) NEGATIVE mg/dL   Nitrite POSITIVE (A) NEGATIVE   Leukocytes, UA SMALL (A) NEGATIVE   RBC / HPF >50 (H) 0 - 5 RBC/hpf   WBC, UA 21-50 0 - 5 WBC/hpf   Bacteria, UA RARE (A) NONE SEEN   Squamous Epithelial / LPF 11-20 0 - 5   Mucus PRESENT   Lipase, blood     Status: None   Collection Time: 01/22/18  3:16 PM  Result Value Ref Range   Lipase 21 11 - 51 U/L  Comprehensive metabolic panel     Status: Abnormal   Collection Time: 01/22/18  3:16 PM  Result Value Ref Range   Sodium 132 (L) 135 - 145 mmol/L   Potassium 3.5 3.5 - 5.1 mmol/L   Chloride 98 98 - 111 mmol/L   CO2 27 22 - 32 mmol/L  Glucose, Bld 107 (H) 70 - 99 mg/dL   BUN 8 6 - 20 mg/dL   Creatinine, Ser 1.61 0.44 - 1.00 mg/dL   Calcium 9.0 8.9 - 09.6 mg/dL   Total Protein 7.3 6.5 - 8.1 g/dL   Albumin 3.8 3.5 - 5.0 g/dL   AST 13 (L) 15 - 41 U/L   ALT 11 0 - 44 U/L   Alkaline Phosphatase 68 38 - 126 U/L   Total Bilirubin 0.6 0.3 - 1.2 mg/dL   GFR calc non Af Amer >60 >60 mL/min   GFR calc Af Amer >60 >60 mL/min   Anion gap 7 5 - 15  CBC     Status: Abnormal   Collection Time: 01/22/18  3:16 PM  Result Value Ref Range   WBC 22.0 (H) 4.0 - 10.5 K/uL   RBC 4.71 3.87 - 5.11 MIL/uL   Hemoglobin 13.9 12.0 - 15.0 g/dL   HCT 04.5 40.9 - 81.1 %   MCV 86.4 80.0 - 100.0 fL   MCH 29.5 26.0 - 34.0 pg   MCHC 34.2 30.0 - 36.0 g/dL   RDW 91.4 78.2 - 95.6 %   Platelets 304 150 - 400 K/uL   nRBC 0.0 0.0 - 0.2 %  Pregnancy, urine POC     Status: None   Collection Time: 01/22/18  3:28 PM  Result Value Ref Range   Preg Test, Ur NEGATIVE NEGATIVE  Chlamydia/NGC rt PCR ([facility] only)     Status: None   Collection Time: 01/22/18  4:38 PM  Result Value Ref Range   Specimen source GC/Chlam ENDOCERVICAL    Chlamydia Tr NOT DETECTED NOT DETECTED   N gonorrhoeae NOT DETECTED NOT  DETECTED  Wet prep, genital     Status: Abnormal   Collection Time: 01/22/18  4:38 PM  Result Value Ref Range   Yeast Wet Prep HPF POC NONE SEEN NONE SEEN   Trich, Wet Prep NONE SEEN NONE SEEN   Clue Cells Wet Prep HPF POC PRESENT (A) NONE SEEN   WBC, Wet Prep HPF POC MODERATE (A) NONE SEEN   Sperm NONE SEEN    ____________________________________________  EKG____________________________________________  RADIOLOGY  I personally reviewed all radiographic images ordered to evaluate for the above acute complaints and reviewed radiology reports and findings.  These findings were personally discussed with the patient.  Please see medical record for radiology report.  ____________________________________________   PROCEDURES  Procedure(s) performed:  .Critical Care Performed by: Willy Eddy, MD Authorized by: Willy Eddy, MD   Critical care provider statement:    Critical care time (minutes):  0 (was not performed.  Epic will not allow this to be removed)   Critical care time was exclusive of:  Separately billable procedures and treating other patients   Critical care was time spent personally by me on the following activities:  Development of treatment plan with patient or surrogate, discussions with consultants, evaluation of patient's response to treatment, examination of patient, obtaining history from patient or surrogate, ordering and performing treatments and interventions, ordering and review of laboratory studies, ordering and review of radiographic studies, pulse oximetry, re-evaluation of patient's condition and review of old charts      Critical Care performed: ___________________   INITIAL IMPRESSION / ASSESSMENT AND PLAN / ED COURSE  Pertinent labs & imaging results that were available during my care of the patient were reviewed by me and considered in my medical decision making (see chart for details).   DDX: pyelo, uti, cystitis,  pid, appendicitis,  colitis, stone  Teresa Lowe is a 27 y.o. who presents to the ED with right sided and right lower quadrant abdominal pain as well as dysuria and foul-smelling vaginal discharge.  Patient with significant leukocytosis and given her significant abdominal pain CT imaging will be ordered to evaluate for the above differential.  Will give IV fluids as well as IV antibiotics and IV pain medication.  Clinical Course as of Jan 22 2022  Mon Jan 22, 2018  1821 Pelvic exam does have significant cervical motion tenderness with purulent discharge emanating from the cervical loss and fairly significant adnexal tenderness right greater than left.  Will consult ob-gyn   [PR]  2002 I discussed with Dr. Elesa Massed who agrees to admit patient for IV antibiotics given to ovarian abscess.   [PR]  2003 Patient became increasingly agitated stating that she was going to be leaving unless we let her smoke a cigarette.  I informed the patient that she cannot leave the ER with IV intact patient ripped her IV out.  I informed the patient that I am concerned for her well-being and that would recommend giving her NicoDerm patch as I understand that she is having cravings for smoking.   I talked to the patient at length and carefully explained, in layman's terms, that the standard of care is to e admitted for IV abx and IVF  and that by leaving against medical advice they not allowing [place] to treat their acute medical conditions according to our best medical judgement, and that a serious adverse outcome including increased pain, suffering, short- or long-term disability and even death, could result. I also explained to the patient that we respect their point-of-view and are not angry. I made sure that they understood that they can, and should, return at any time if they change their mind. This conversation was witnessed by nursing staff. The patient indicated understanding of our conversation but still desired to leave against medical advice. I  deemed  to be of sound mind to make this decision and demonstrate understanding of our conversation.    [PR]    Clinical Course User Index [PR] Willy Eddy, MD     As part of my medical decision making, I reviewed the following data within the electronic MEDICAL RECORD NUMBER Nursing notes reviewed and incorporated, Labs reviewed, notes from prior ED visits.  ____________________________________________   FINAL CLINICAL IMPRESSION(S) / ED DIAGNOSES  Final diagnoses:  PID (pelvic inflammatory disease)  TOA (tubo-ovarian abscess)      NEW MEDICATIONS STARTED DURING THIS VISIT:  Discharge Medication List as of 01/22/2018  8:11 PM       Note:  This document was prepared using Dragon voice recognition software and may include unintentional dictation errors.    Willy Eddy, MD 01/22/18 Maureen Chatters    Willy Eddy, MD 01/22/18 2009    Willy Eddy, MD 01/22/18 2010    Willy Eddy, MD 01/22/18 2023

## 2018-01-22 NOTE — ED Notes (Signed)
Pt requesting to go outside to smoke a cigarette. Pt instructed that she is not able to walk outside with a IV in her arm. Pt attempting to take IV out by herself. IV removed by nurse per pt request. Pt yelling at staff saying that she is going to leave because she will not be treated like a child and she should be able to walk outside with a IV in her arm. Pt given the risks of leaving AMA.

## 2018-01-22 NOTE — ED Triage Notes (Signed)
Pt presents to ED c/o R-sided flank pain wrapping around to RLQ. +dysuria Reports foul smell to urine.

## 2018-01-23 ENCOUNTER — Emergency Department (HOSPITAL_COMMUNITY)
Admission: EM | Admit: 2018-01-23 | Discharge: 2018-01-23 | Disposition: A | Discharge status: Home / Self Care | Payer: Self-pay | Attending: Emergency Medicine | Admitting: Emergency Medicine

## 2018-01-23 ENCOUNTER — Other Ambulatory Visit: Payer: Self-pay

## 2018-01-23 ENCOUNTER — Emergency Department (HOSPITAL_COMMUNITY): Payer: Self-pay

## 2018-01-23 ENCOUNTER — Encounter (HOSPITAL_COMMUNITY): Payer: Self-pay

## 2018-01-23 DIAGNOSIS — N730 Acute parametritis and pelvic cellulitis: Secondary | ICD-10-CM | POA: Insufficient documentation

## 2018-01-23 DIAGNOSIS — F17210 Nicotine dependence, cigarettes, uncomplicated: Secondary | ICD-10-CM | POA: Insufficient documentation

## 2018-01-23 DIAGNOSIS — Z79899 Other long term (current) drug therapy: Secondary | ICD-10-CM | POA: Insufficient documentation

## 2018-01-23 LAB — CBC WITH DIFFERENTIAL/PLATELET
Abs Immature Granulocytes: 0.05 10*3/uL (ref 0.00–0.07)
BASOS ABS: 0 10*3/uL (ref 0.0–0.1)
BASOS PCT: 0 %
EOS ABS: 0.2 10*3/uL (ref 0.0–0.5)
EOS PCT: 2 %
HCT: 38 % (ref 36.0–46.0)
Hemoglobin: 12.2 g/dL (ref 12.0–15.0)
IMMATURE GRANULOCYTES: 0 %
Lymphocytes Relative: 20 %
Lymphs Abs: 2.4 10*3/uL (ref 0.7–4.0)
MCH: 28.4 pg (ref 26.0–34.0)
MCHC: 32.1 g/dL (ref 30.0–36.0)
MCV: 88.6 fL (ref 80.0–100.0)
Monocytes Absolute: 0.8 10*3/uL (ref 0.1–1.0)
Monocytes Relative: 7 %
NEUTROS PCT: 71 %
NRBC: 0 % (ref 0.0–0.2)
Neutro Abs: 8.3 10*3/uL — ABNORMAL HIGH (ref 1.7–7.7)
PLATELETS: 277 10*3/uL (ref 150–400)
RBC: 4.29 MIL/uL (ref 3.87–5.11)
RDW: 14.1 % (ref 11.5–15.5)
WBC: 11.8 10*3/uL — AB (ref 4.0–10.5)

## 2018-01-23 LAB — BASIC METABOLIC PANEL
ANION GAP: 6 (ref 5–15)
BUN: <5 mg/dL — ABNORMAL LOW (ref 6–20)
CALCIUM: 8.8 mg/dL — AB (ref 8.9–10.3)
CO2: 24 mmol/L (ref 22–32)
CREATININE: 0.45 mg/dL (ref 0.44–1.00)
Chloride: 106 mmol/L (ref 98–111)
Glucose, Bld: 123 mg/dL — ABNORMAL HIGH (ref 70–99)
Potassium: 3.5 mmol/L (ref 3.5–5.1)
SODIUM: 136 mmol/L (ref 135–145)

## 2018-01-23 MED ORDER — NICOTINE 21 MG/24HR TD PT24
1.00 | MEDICATED_PATCH | TRANSDERMAL | Status: DC
Start: ? — End: 2018-01-23

## 2018-01-23 MED ORDER — DOXYCYCLINE HYCLATE 100 MG PO CAPS: 100.0000 mg | ORAL_CAPSULE | Freq: Two times a day (BID) | ORAL | 0 refills | Status: AC

## 2018-01-23 MED ORDER — GENERIC EXTERNAL MEDICATION
100.00 | Status: DC
Start: 2018-01-23 — End: 2018-01-23

## 2018-01-23 MED ORDER — DOXYCYCLINE HYCLATE 100 MG PO TABS
100.0000 mg | ORAL_TABLET | Freq: Two times a day (BID) | ORAL | Status: DC
  Administered 2018-01-23: 100 mg via ORAL

## 2018-01-23 MED ORDER — DOXYCYCLINE HYCLATE 100 MG PO TABS
100.0000 mg | ORAL_TABLET | Freq: Two times a day (BID) | ORAL | Status: DC
  Filled 2018-01-23: qty 1

## 2018-01-23 MED ORDER — NICOTINE 14 MG/24HR TD PT24
14.0000 mg | MEDICATED_PATCH | Freq: Once | TRANSDERMAL | Status: DC
  Administered 2018-01-23: 14 mg via TRANSDERMAL
  Filled 2018-01-23: qty 1

## 2018-01-23 MED ORDER — SODIUM CHLORIDE 0.9 % IV SOLN
2.0000 g | Freq: Four times a day (QID) | INTRAVENOUS | Status: DC
  Administered 2018-01-23: 2 g via INTRAVENOUS
  Filled 2018-01-23 (×4): qty 2

## 2018-01-23 MED ORDER — GENERIC EXTERNAL MEDICATION
2.00 | Status: DC
Start: 2018-01-23 — End: 2018-01-23

## 2018-01-23 NOTE — ED Triage Notes (Signed)
Pt reports went to [facility] yesterday for abd pain. Waited 8 hours and then left ama went to [facility]. Pt was admitted there and was told she had a ovarian abscess. Pt left admission on floor due to pt states "provider were mean."

## 2018-01-23 NOTE — Discharge Instructions (Signed)
Please be sure to follow-up with our OB/ GYN physician, Dr. Despina Hidden.   Return here for concerning changes in your condition.

## 2018-01-23 NOTE — ED Notes (Signed)
Pt eating food from home, Okay with EDP

## 2018-01-23 NOTE — ED Notes (Signed)
Pt back from Ultrasound

## 2018-01-23 NOTE — ED Provider Notes (Signed)
[facility] EMERGENCY DEPARTMENT Provider Note   CSN: [id number] Arrival date & time: 01/23/18  1523     History   Chief Complaint Chief Complaint  Patient presents with  . Abdominal Pain    HPI Teresa Lowe is a 27 y.o. female.  HPI Patient presents 1 day after being evaluated at our affiliated facility, and several hours after leaving a different facility, now with concern for ongoing lower abdominal pain, nausea. She acknowledges multiple medical issues, and states that few days ago she began having very sharp lower abdominal pain, vaginal discharge.  When she went to [facility] yesterday, was diagnosed, with CT scan, with tubo-ovarian abscess, left prior to initiating therapy. Today, with ongoing pain, without new fever she went to [facility], received initial antibiotics, and left AGAINST MEDICAL ADVICE. She notes that she continues to have abdominal pain, continues to have frustration about it, continues to have vaginal discharge, and requests evaluation. The pain is primarily in the right lower quadrant, with mild lower abdominal radiation.  There is no associated nausea, but no vomiting, and no clear alleviating factors. She is here with her brother.   Past Medical History:  Diagnosis Date  . Cyst, ovary theca lutein    right  . Drug abuse (HCC)   . Gallstones   . Kidney disease   . Renal insufficiency     Patient Active Problem List   Diagnosis Date Noted  . Elevated prolactin level (HCC) 04/30/2017  . Drug abuse (HCC) 11/01/2016  . Heroin addiction (HCC) 11/01/2016  . Closed displaced transverse fracture of acetabulum (HCC) 10/02/2015  . Noncompliance 10/02/2015    Past Surgical History:  Procedure Laterality Date  . CESAREAN SECTION    . FRACTURE SURGERY     left hip  . JOINT REPLACEMENT     left     OB History    Gravida  1   Para  1   Term  1   Preterm  0   AB  0   Living  1     SAB  0   TAB  0   Ectopic  0   Multiple     Live Births  1            Home Medications    Prior to Admission medications   Medication Sig Start Date End Date Taking? Authorizing Provider  clotrimazole-betamethasone (LOTRISONE) cream Apply to affected area 2 times daily Patient not taking: Reported on 01/22/2018 02/19/17 02/19/18  Enid Derry, PA-C  cyclobenzaprine (FLEXERIL) 5 MG tablet Take 1 tablet (5 mg total) by mouth 3 (three) times daily as needed for muscle spasms. Patient not taking: Reported on 01/22/2018 09/22/16   Menshew, Charlesetta Ivory, PA-C  diclofenac (VOLTAREN) 75 MG EC tablet Take 1 tablet (75 mg total) by mouth 2 (two) times daily. Patient not taking: Reported on 01/22/2018 09/22/16   Menshew, Charlesetta Ivory, PA-C  ibuprofen (ADVIL,MOTRIN) 600 MG tablet Take 1 tablet (600 mg total) by mouth every 8 (eight) hours as needed. Patient not taking: Reported on 01/22/2018 02/01/17   Joni Reining, PA-C  naproxen (NAPROSYN) 500 MG tablet Take 1 tablet (500 mg total) by mouth 2 (two) times daily with a meal. Patient not taking: Reported on 01/22/2018 05/29/17   Kem Boroughs B, FNP  ondansetron (ZOFRAN ODT) 8 MG disintegrating tablet Take 1 tablet (8 mg total) by mouth every 8 (eight) hours as needed for nausea or vomiting. Patient not taking: Reported  on 01/22/2018 01/06/17   Little, Traci M, PA-C  oxyCODONE-acetaminophen (ROXICET) 5-325 MG tablet Take 1 tablet by mouth every 6 (six) hours as needed for moderate pain. Patient not taking: Reported on 01/22/2018 02/01/17   Joni Reining, PA-C  SUBOXONE 8-2 MG FILM Place 2 Film under the tongue daily. 08/16/16   [provider]  sulfamethoxazole-trimethoprim (BACTRIM DS,SEPTRA DS) 800-160 MG tablet Take 1 tablet by mouth 2 (two) times daily. Patient not taking: Reported on 01/22/2018 05/29/17   Kem Boroughs B, FNP  traMADol (ULTRAM) 50 MG tablet Take 1 tablet (50 mg total) by mouth every 6 (six) hours as needed. Patient not taking: Reported on 01/22/2018  05/29/17   Kem Boroughs B, FNP  Vitamin D, Ergocalciferol, (DRISDOL) 50000 units CAPS capsule Take 1 capsule (50,000 Units total) by mouth every 7 (seven) days. Patient not taking: Reported on 01/22/2018 05/26/16   Gunnar Bulla, CNM    Family History Family History  Problem Relation Age of Onset  . Breast cancer Paternal Grandmother   . Diabetes Paternal Grandmother   . Ovarian cancer Neg Hx   . Colon cancer Neg Hx     Social History Social History   Tobacco Use  . Smoking status: Current Every Day Smoker    Packs/day: 1.00    Types: Cigarettes    Last attempt to quit: 11/15/2016    Years since quitting: 1.1  . Smokeless tobacco: Never Used  Substance Use Topics  . Alcohol use: No  . Drug use: No     Allergies   Patient has no known allergies.   Review of Systems Review of Systems  Constitutional:       Per HPI, otherwise negative  HENT:       Per HPI, otherwise negative  Respiratory:       Per HPI, otherwise negative  Cardiovascular:       Per HPI, otherwise negative  Gastrointestinal: Positive for abdominal pain and nausea. Negative for vomiting.  Endocrine:       Negative aside from HPI  Genitourinary:       Neg aside from HPI   Musculoskeletal:       Per HPI, otherwise negative  Skin: Negative.   Neurological: Negative for syncope.     Physical Exam Updated Vital Signs BP 126/76 (BP Location: Left Arm)   Pulse 97   Temp 98 F (36.7 C) (Oral)   Resp 18   Ht 5\' 6"  (1.676 m)   Wt 79.4 kg   LMP 12/11/2017   SpO2 100%   BMI 28.25 kg/m   Physical Exam  Constitutional: She is oriented to person, place, and time. She appears well-developed and well-nourished.  Uncomfortable appearing young female awake and alert  HENT:  Head: Normocephalic and atraumatic.  Eyes: Conjunctivae and EOM are normal.  Cardiovascular: Normal rate and regular rhythm.  Pulmonary/Chest: Effort normal and breath sounds normal. No stridor. No respiratory  distress.  Abdominal: She exhibits no distension. There is tenderness in the right lower quadrant and suprapubic area. There is no rigidity.  Musculoskeletal: She exhibits no edema.  Neurological: She is alert and oriented to person, place, and time. No cranial nerve deficit.  Skin: Skin is warm and dry.  Psychiatric: Her mood appears anxious. Cognition and memory are not impaired.  Nursing note and vitals reviewed.    ED Treatments / Results  Labs (all labs ordered are listed, but only abnormal results are displayed) Labs Reviewed  CBC WITH DIFFERENTIAL/PLATELET  BASIC METABOLIC PANEL     Radiology  CT study from yesterday reviewed, results as below.  Ct Abdomen Pelvis W Contrast  Result Date: 01/22/2018 CLINICAL DATA:  27 year old with acute abdominal pain. Right flank pain. EXAM: CT ABDOMEN AND PELVIS WITH CONTRAST TECHNIQUE: Multidetector CT imaging of the abdomen and pelvis was performed using the standard protocol following bolus administration of intravenous contrast. CONTRAST:  ISOVUE-300 IOPAMIDOL (ISOVUE-300) INJECTION 61% COMPARISON:  01/06/2017 FINDINGS: Lower chest: Dependent densities in the right lower lobe are most compatible with atelectasis. Hepatobiliary: Small calcified gallstone in the gallbladder. The gallbladder is small and decompressed. Normal appearance of the liver without biliary dilatation. Main portal venous system is patent. Pancreas: Unremarkable. No pancreatic ductal dilatation or surrounding inflammatory changes. Spleen: Normal in size without focal abnormality. Adrenals/Urinary Tract: Normal adrenal glands. Normal appearance of both kidneys without hydronephrosis. No suspicious renal lesions. Urinary bladder is poorly characterized on this examination and contains a small amount of fluid. Stomach/Bowel: Stomach is within normal limits. Appendix appears normal. No evidence of bowel wall thickening, distention, or inflammatory changes.  Vascular/Lymphatic: Arterial and venous structures are unremarkable. Prominent periaortic lymph nodes are similar to the previous examination although there is a lymph node between the aorta and inferior vena cava which is slightly more prominent measuring 0.7 cm in short axis on sequence 2, image 45. These nodes are likely reactive in etiology. Reproductive: There is a dilated tubular structure in the posterior pelvis which appears to be associated with the right adnexa. Findings are suggestive for a dilated right fallopian tube. The fallopian tube measures up to 3.5 cm in diameter with wall enhancement. There is fluid in the right pelvis. Unfortunately, pelvic structures are difficult to evaluate due to the artifact from the surgical hardware in the left pelvic bones. There is also concern for a dilated left fallopian tube. The uterus is within normal limits. Other: Small amount of free fluid in the pelvis. Negative for free air. Musculoskeletal: Surgical plate and screws involving the left side of the pelvis and left superior pubic ramus. Subchondral cysts in the left femoral head with secondary degenerative disease. Both hips are located. IMPRESSION: 1. Dilated tubular structure in the pelvis most likely represents a dilated right fallopian tube. In addition, there may be dilatation of the left fallopian tube. Fluid or inflammatory changes along the posterior aspect of the pelvis. Findings are suggestive for pelvic inflammatory disease with a tubo-ovarian abscess. This area may be better characterized with a pelvic ultrasound because this study has limitations related to the artifact from the osseous surgical hardware. 2. Cholelithiasis without gallbladder inflammatory changes. 3. Normal appendix. Electronically Signed   By: Richarda Overlie M.D.   On: 01/22/2018 17:32    US IMPRESSION (today): 1. Dilated tubular structure in the pelvis most likely represents a dilated right fallopian tube. In addition, there may  be dilatation of the left fallopian tube. Fluid or inflammatory changes along the posterior aspect of the pelvis. Findings are suggestive for pelvic inflammatory disease with a tubo-ovarian abscess. This area may be better characterized with a pelvic ultrasound because this study has limitations related to the artifact from the osseous surgical hardware. 2. Cholelithiasis without gallbladder inflammatory changes. 3. Normal appendix.     Electronically Signed   By: Richarda Overlie M.D.   On: 01/22/2018 17:32    Procedures Procedures (including critical care time)  Medications Ordered in ED Medications  nicotine (NICODERM CQ - dosed in mg/24 hours) patch 14 mg (has  no administration in time range)  cefOXitin (MEFOXIN) 2 g in sodium chloride 0.9 % 100 mL IVPB (has no administration in time range)  doxycycline (VIBRA-TABS) tablet 100 mg (has no administration in time range)     Initial Impression / Assessment and Plan / ED Course  I have reviewed the triage vital signs and the nursing notes.  Pertinent labs & imaging results that were available during my care of the patient were reviewed by me and considered in my medical decision making (see chart for details).    After the initial evaluation I reviewed the patient's chart including documentation from yesterday's ED visit. CT imaging also reviewed, notable for likely PID, TOA.  Chart review notable for physical exam findings as below, and CT findings as above  Gastrointestinal: Soft with ttp in RLQ No distention. No abdominal bruits. No CVA tenderness. Genitourinary:  cmt with foul smelling purulent discharge  from cervical os.  + right adnexal ttp    5:27 PM With concern for PID/TOA the patient received IV antibiotics.   This young female presents with ongoing lower abdominal pain, CT scan from yesterday, and physical exam both consistent with PID/TOA. Patient is awake and alert, afebrile, but given these concerns required  initiation of IV antibiotics.   7:39 PM Patient in no distress, has eaten dinner without p.o. intolerance, she is hemodynamically unremarkable. I reviewed the patient's labs, and discussed them with her gynecologist on call. With leukocytosis 50% of yesterday's value, no persistent nausea, vomiting, no fever, and no abscess identified on ultrasound tonight, patient is appropriate for discharge with close outpatient follow-up. Subsequently discussed this with patient and her female companion, she is agreeable with plan, acknowledges importance of taking all medicine as directed, following up.   Final Clinical Impressions(s) / ED Diagnoses  PID   Gerhard Munch, MD 01/23/18 1940

## 2018-02-16 ENCOUNTER — Encounter: Payer: Self-pay | Admitting: *Deleted

## 2018-02-16 ENCOUNTER — Encounter: Payer: Self-pay | Admitting: Obstetrics & Gynecology

## 2018-02-22 DIAGNOSIS — N739 Female pelvic inflammatory disease, unspecified: Secondary | ICD-10-CM | POA: Insufficient documentation

## 2018-02-23 DIAGNOSIS — F1193 Opioid use, unspecified with withdrawal: Secondary | ICD-10-CM | POA: Insufficient documentation

## 2018-02-23 DIAGNOSIS — F1920 Other psychoactive substance dependence, uncomplicated: Secondary | ICD-10-CM | POA: Insufficient documentation

## 2019-04-02 DIAGNOSIS — F1910 Other psychoactive substance abuse, uncomplicated: Secondary | ICD-10-CM | POA: Insufficient documentation

## 2020-03-29 IMAGING — US US EXTREM  UP VENOUS*L*
1 series · 14 of 24 positions shown · non-contrast
Comparison: None.

CLINICAL DATA: Pain, axillary swelling x1 week

EXAM:
LEFT UPPER EXTREMITY VENOUS DOPPLER ULTRASOUND
TECHNIQUE: Gray-scale sonography with graded compression, as well as color
Doppler and duplex ultrasound were performed to evaluate the upper
extremity deep venous system from the level of the subclavian vein
and including the jugular, axillary, basilic and upper cephalic
vein. Spectral Doppler was utilized to evaluate flow at rest and
with distal augmentation maneuvers.

[Series 1: us extrem up venous*left* · 14 of 36 slices shown]
[im 1/36]
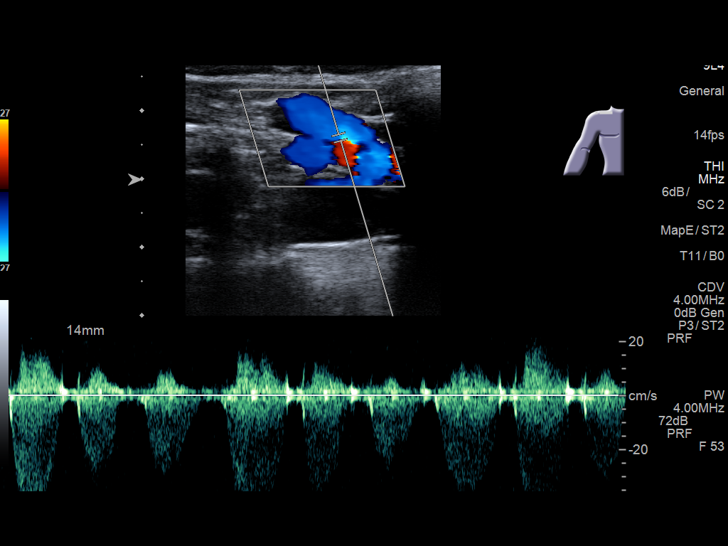
[im 4/36]
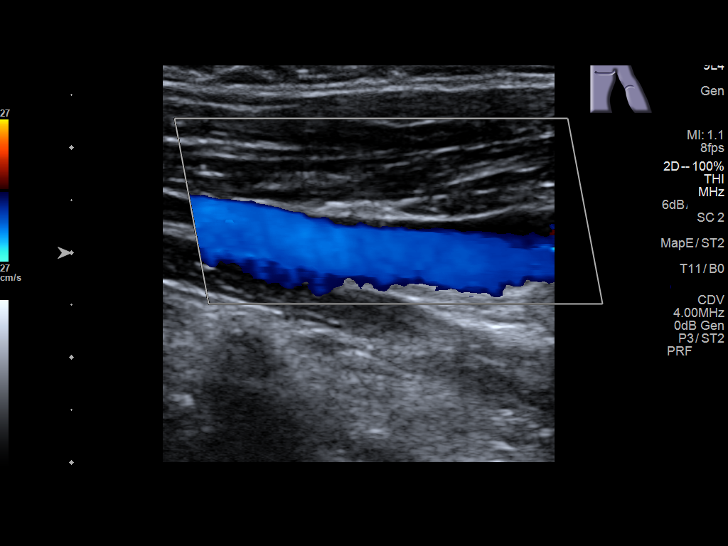
[im 7/36]
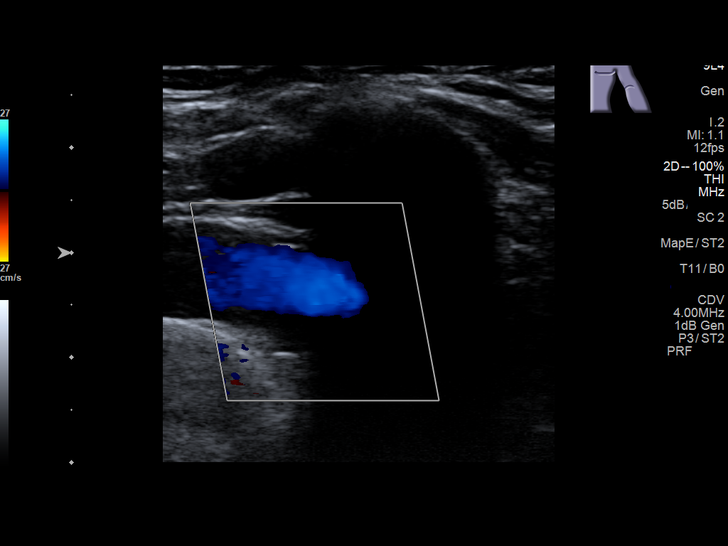
[im 10/36]
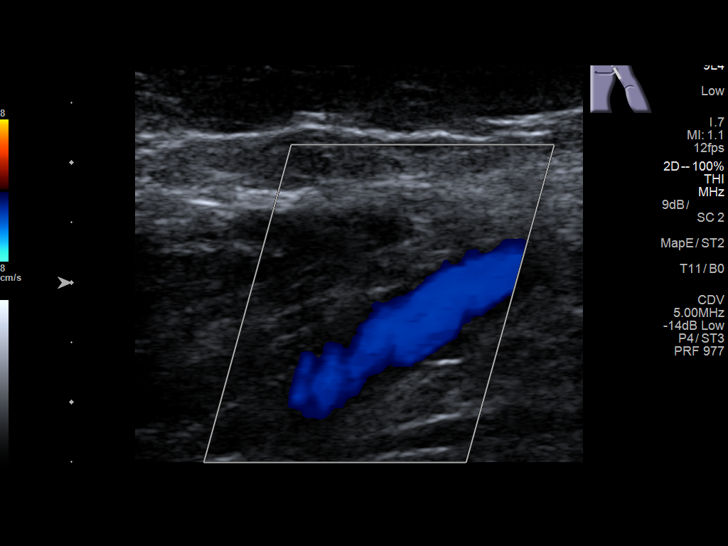
[im 11/36]
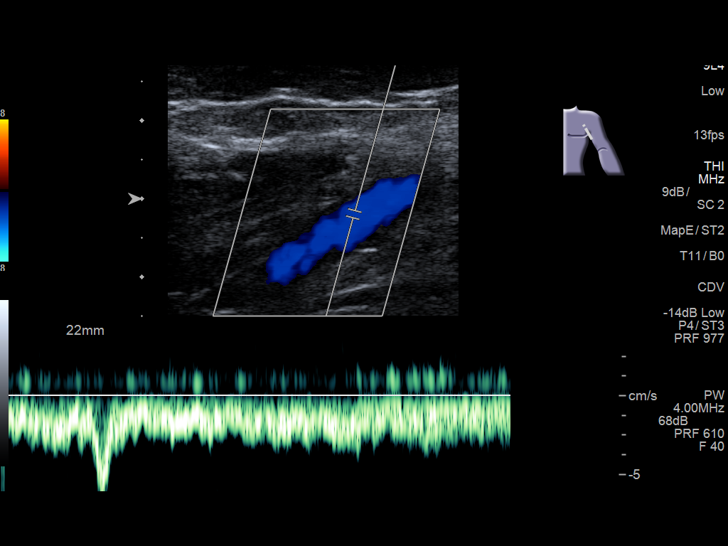
[im 14/36]
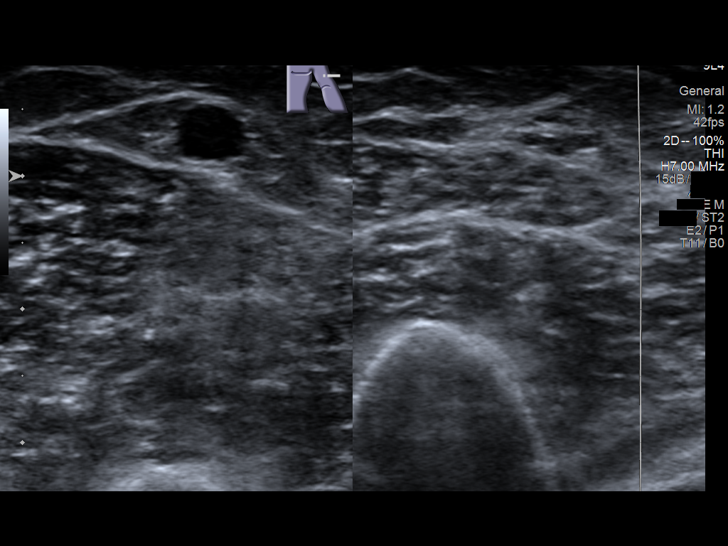
[im 17/36]
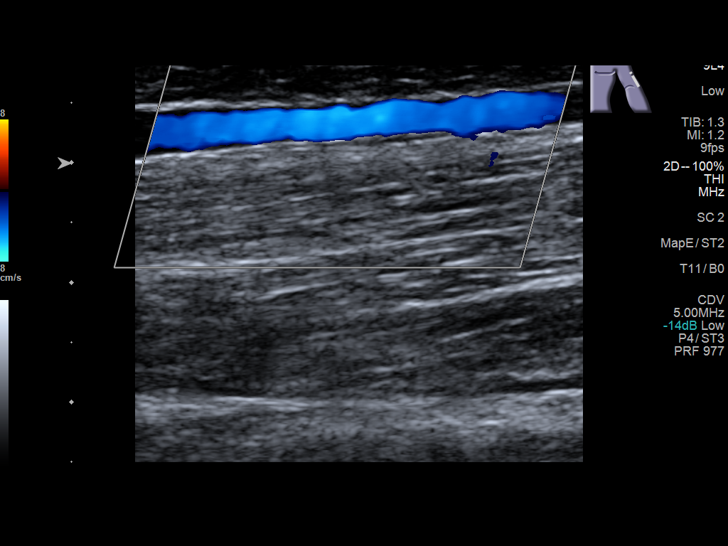
[im 19/36]
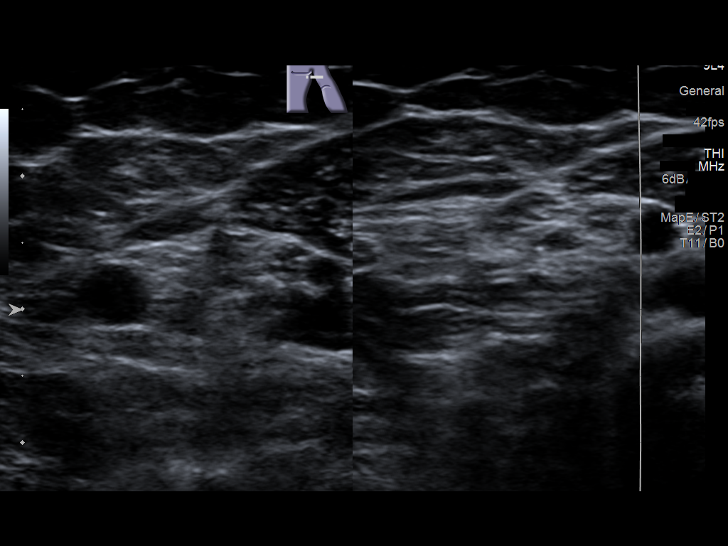
[im 22/36]
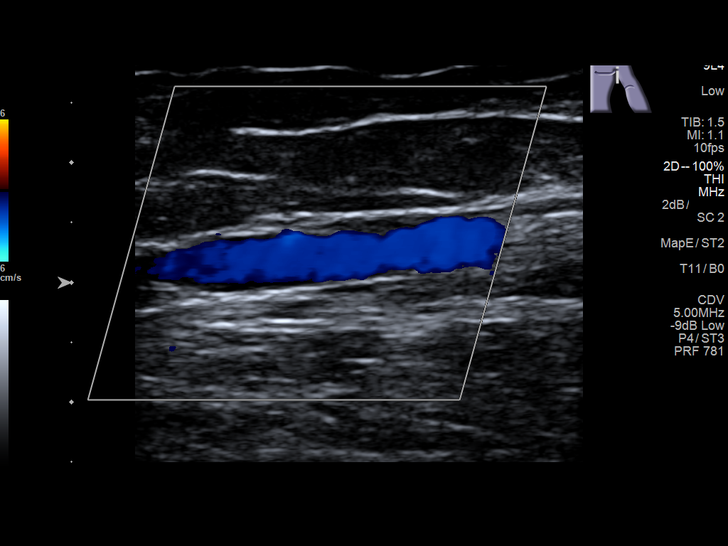
[im 25/36]
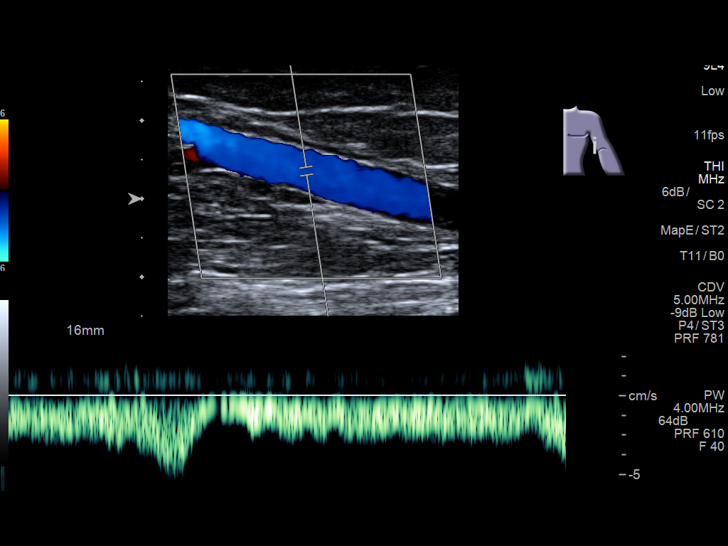
[im 28/36]
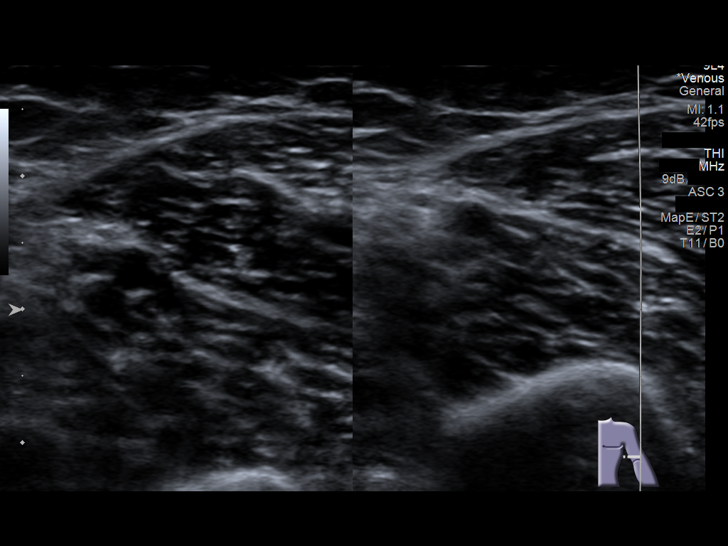
[im 29/36]
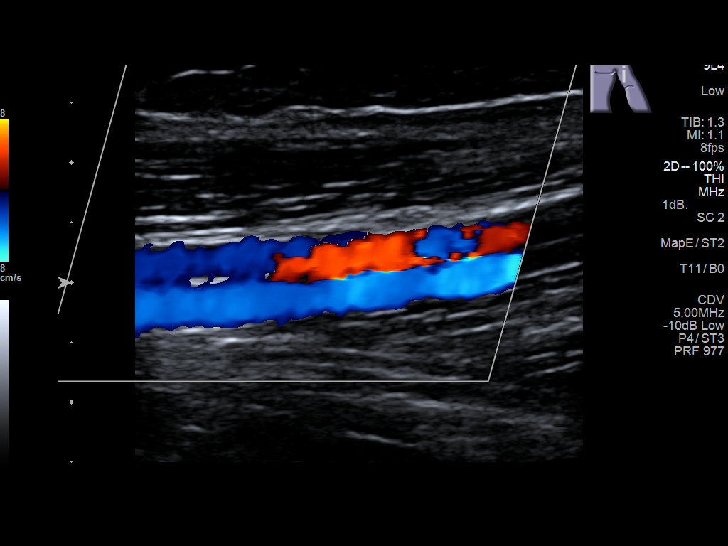
[im 32/36]
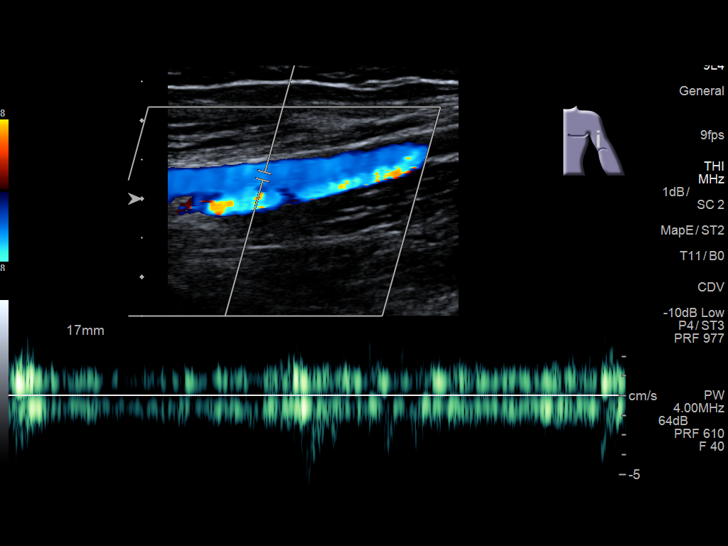
[im 36/36]
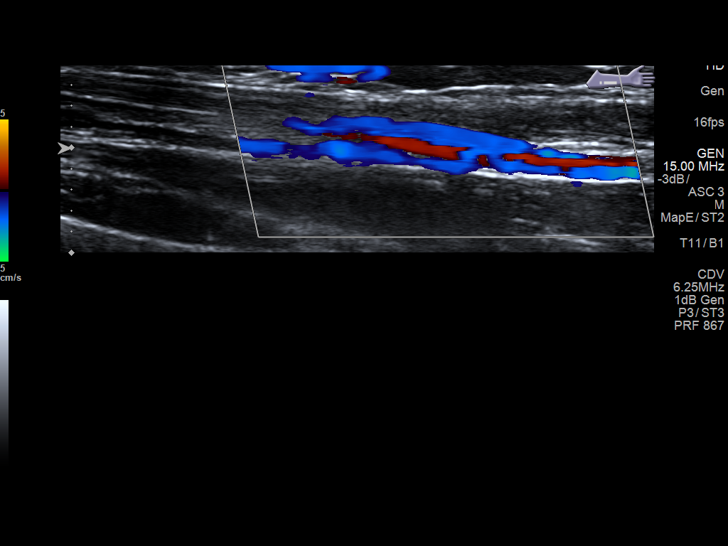

[14 of 24 positions shown; findings below may reference images not displayed]

FINDINGS: Thrombus within deep veins:  None visualized.

Compressibility of deep veins:  Normal.

Duplex waveform respiratory phasicity:  Normal.

Duplex waveform response to augmentation:  Normal.

Venous reflux:  None visualized.

Other findings: Limited images of the contralateral right subclavian
vein unremarkable.
IMPRESSION: 1. Negative for left upper extremity DVT.

## 2021-11-20 DIAGNOSIS — F22 Delusional disorders: Secondary | ICD-10-CM | POA: Insufficient documentation

## 2022-07-17 ENCOUNTER — Encounter (HOSPITAL_COMMUNITY): Payer: Self-pay

## 2022-07-17 ENCOUNTER — Emergency Department (HOSPITAL_COMMUNITY)
Admission: EM | Admit: 2022-07-17 | Discharge: 2022-07-17 | Discharge status: Against Medical Advice | Payer: Medicaid Other | Attending: Emergency Medicine | Admitting: Emergency Medicine

## 2022-07-17 DIAGNOSIS — Z3A01 Less than 8 weeks gestation of pregnancy: Secondary | ICD-10-CM | POA: Insufficient documentation

## 2022-07-17 DIAGNOSIS — Z5329 Procedure and treatment not carried out because of patient's decision for other reasons: Secondary | ICD-10-CM | POA: Insufficient documentation

## 2022-07-17 DIAGNOSIS — F458 Other somatoform disorders: Secondary | ICD-10-CM

## 2022-07-17 DIAGNOSIS — O99341 Other mental disorders complicating pregnancy, first trimester: Secondary | ICD-10-CM | POA: Insufficient documentation

## 2022-07-17 DIAGNOSIS — F22 Delusional disorders: Secondary | ICD-10-CM | POA: Diagnosis not present

## 2022-07-17 DIAGNOSIS — O26891 Other specified pregnancy related conditions, first trimester: Secondary | ICD-10-CM | POA: Diagnosis present

## 2022-07-17 LAB — CBC WITH DIFFERENTIAL/PLATELET
Abs Immature Granulocytes: 0.02 10*3/uL (ref 0.00–0.07)
Basophils Absolute: 0 10*3/uL (ref 0.0–0.1)
Basophils Relative: 0 %
Eosinophils Absolute: 0.1 10*3/uL (ref 0.0–0.5)
Eosinophils Relative: 1 %
HCT: 45.5 % (ref 36.0–46.0)
Hemoglobin: 15.2 g/dL — ABNORMAL HIGH (ref 12.0–15.0)
Immature Granulocytes: 0 %
Lymphocytes Relative: 24 %
Lymphs Abs: 2.2 10*3/uL (ref 0.7–4.0)
MCH: 29.5 pg (ref 26.0–34.0)
MCHC: 33.4 g/dL (ref 30.0–36.0)
MCV: 88.2 fL (ref 80.0–100.0)
Monocytes Absolute: 0.7 10*3/uL (ref 0.1–1.0)
Monocytes Relative: 8 %
Neutro Abs: 6.1 10*3/uL (ref 1.7–7.7)
Neutrophils Relative %: 67 %
Platelets: 358 10*3/uL (ref 150–400)
RBC: 5.16 MIL/uL — ABNORMAL HIGH (ref 3.87–5.11)
RDW: 13.6 % (ref 11.5–15.5)
WBC: 9.1 10*3/uL (ref 4.0–10.5)
nRBC: 0 % (ref 0.0–0.2)

## 2022-07-17 LAB — COMPREHENSIVE METABOLIC PANEL
ALT: 38 U/L (ref 0–44)
AST: 23 U/L (ref 15–41)
Albumin: 4.2 g/dL (ref 3.5–5.0)
Alkaline Phosphatase: 73 U/L (ref 38–126)
Anion gap: 10 (ref 5–15)
BUN: 18 mg/dL (ref 6–20)
CO2: 22 mmol/L (ref 22–32)
Calcium: 9.1 mg/dL (ref 8.9–10.3)
Chloride: 103 mmol/L (ref 98–111)
Creatinine, Ser: 0.63 mg/dL (ref 0.44–1.00)
GFR, Estimated: >60 mL/min (ref >60)
Glucose, Bld: 105 mg/dL — ABNORMAL HIGH (ref 70–99)
Potassium: 4 mmol/L (ref 3.5–5.1)
Sodium: 135 mmol/L (ref 135–145)
Total Bilirubin: 0.5 mg/dL (ref 0.3–1.2)
Total Protein: 7.9 g/dL (ref 6.5–8.1)

## 2022-07-17 LAB — I-STAT BETA HCG BLOOD, ED (MC, WL, AP ONLY): I-stat hCG, quantitative: <5 m[IU]/mL (ref <5)

## 2022-07-17 NOTE — ED Triage Notes (Signed)
Patient presenting today because she feels that she is overdue in her pregnancy, reports being due in February 2024. When asked if baby is moving patient states "somewhat, I feel it in my back". Pt c/o nausea, vomiting, and cramping. Denies bleeding.

## 2022-07-17 NOTE — ED Provider Notes (Signed)
[place] EMERGENCY DEPARTMENT AT [facility] Provider Note   CSN: [id number] Arrival date & time: 07/17/22  1228     History  Chief Complaint  Patient presents with   Emesis During Pregnancy    Teresa Lowe is a 32 y.o. female.  HPI 32 year old female presents with concern for pregnancy.  She states she has been pregnant since June and was due in February.  She has been having some bilateral flank pain and abdominal pain.  She has had some nausea and vomiting.  No bleeding or loss of fluid.  She states she is feeling baby move.  Home Medications Prior to Admission medications   Medication Sig Start Date End Date Taking? Authorizing Provider  doxycycline (VIBRAMYCIN) 100 MG capsule Take 1 capsule (100 mg total) by mouth 2 (two) times daily. 01/23/18   Gerhard Munch, MD  SUBOXONE 8-2 MG FILM Place 2 Film under the tongue once.  08/16/16   [provider]      Allergies    Patient has no known allergies.    Review of Systems   Review of Systems  Gastrointestinal:  Positive for abdominal distention and abdominal pain.  Genitourinary:  Positive for menstrual problem.    Physical Exam Updated Vital Signs BP 126/78 (BP Location: Right Arm)   Pulse 95   Temp 97.7 F (36.5 C) (Oral)   Resp 18   Ht 5\' 4"  (1.626 m)   Wt 90.7 kg   LMP 09/24/2021 (Approximate)   SpO2 100%   BMI 34.33 kg/m  Physical Exam Vitals and nursing note reviewed.  Constitutional:      Appearance: She is well-developed.  HENT:     Head: Normocephalic and atraumatic.  Cardiovascular:     Rate and Rhythm: Normal rate and regular rhythm.     Heart sounds: Normal heart sounds.  Pulmonary:     Effort: Pulmonary effort is normal.     Breath sounds: Normal breath sounds.  Abdominal:     Palpations: Abdomen is soft.     Tenderness: There is abdominal tenderness (Mild generalized discomfort). There is right CVA tenderness and left CVA tenderness.  Skin:    General: Skin is warm and  dry.  Neurological:     Mental Status: She is alert.     ED Results / Procedures / Treatments   Labs (all labs ordered are listed, but only abnormal results are displayed) Labs Reviewed  CBC WITH DIFFERENTIAL/PLATELET - Abnormal; Notable for the following components:      Result Value   RBC 5.16 (*)    Hemoglobin 15.2 (*)    All other components within normal limits  COMPREHENSIVE METABOLIC PANEL  URINALYSIS, W/ REFLEX TO CULTURE (INFECTION SUSPECTED)  RAPID URINE DRUG SCREEN, HOSP PERFORMED  I-STAT BETA HCG BLOOD, ED ([facility], [facility], [facility] ONLY)    EKG None  Radiology No results found.  Procedures Procedures    Medications Ordered in ED Medications - No data to display  ED Course/ Medical Decision Making/ A&P                             Medical Decision Making Amount and/or Complexity of Data Reviewed Labs: ordered.   Chart review shows that she had a visit to [facility] in August 2023 where she was diagnosed with delusion of pregnancy and had a negative pregnancy test.  When I told her that her pregnancy test today is negative, she became  angry and agitated.  Mom who is in the room is also concerned that we are missed diagnosing her.  I tried to call patient down but she became more agitated and wanted to leave.  She asked for the IV to be removed.  I discussed that while she might not be pregnant we can still assess why she is having symptoms and check further labs and imaging.  However she became angry and wanted the IV out and then wanted to leave.  Mom does not seem to believe is that she is not pregnant.  Both of them would like to leave and will leave AGAINST MEDICAL ADVICE.  I discussed that certain medical conditions could be missed and this could result in long-term or acute harm.  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  I do think there is some underlying psych disease causing this, but I am not sure that this  is enough to hold her against her well for this psychosis as she is not causing herself specific harm at this time.  She is going in mom's care at this time.        Final Clinical Impression(s) / ED Diagnoses Final diagnoses:  Delusion of pregnancy    Rx / DC Orders ED Discharge Orders     None         Pricilla Loveless, MD 07/17/22 [phone]

## 2022-07-29 ENCOUNTER — Other Ambulatory Visit: Payer: Self-pay

## 2022-07-29 ENCOUNTER — Encounter (HOSPITAL_COMMUNITY): Payer: Self-pay | Admitting: Emergency Medicine

## 2022-07-29 ENCOUNTER — Emergency Department (HOSPITAL_COMMUNITY): Payer: Medicaid Other

## 2022-07-29 ENCOUNTER — Emergency Department (HOSPITAL_COMMUNITY)
Admission: EM | Admit: 2022-07-29 | Discharge: 2022-07-29 | Discharge status: Against Medical Advice | Payer: Medicaid Other | Attending: Emergency Medicine | Admitting: Emergency Medicine

## 2022-07-29 DIAGNOSIS — Z5321 Procedure and treatment not carried out due to patient leaving prior to being seen by health care provider: Secondary | ICD-10-CM | POA: Insufficient documentation

## 2022-07-29 DIAGNOSIS — Z3A00 Weeks of gestation of pregnancy not specified: Secondary | ICD-10-CM | POA: Diagnosis not present

## 2022-07-29 DIAGNOSIS — O481 Prolonged pregnancy: Secondary | ICD-10-CM | POA: Insufficient documentation

## 2022-07-29 DIAGNOSIS — R1084 Generalized abdominal pain: Secondary | ICD-10-CM

## 2022-07-29 LAB — CBC WITH DIFFERENTIAL/PLATELET
Abs Immature Granulocytes: 0.04 10*3/uL (ref 0.00–0.07)
Basophils Absolute: 0 10*3/uL (ref 0.0–0.1)
Basophils Relative: 0 %
Eosinophils Absolute: 0.2 10*3/uL (ref 0.0–0.5)
Eosinophils Relative: 2 %
HCT: 43.1 % (ref 36.0–46.0)
Hemoglobin: 14 g/dL (ref 12.0–15.0)
Immature Granulocytes: 0 %
Lymphocytes Relative: 21 %
Lymphs Abs: 2.1 10*3/uL (ref 0.7–4.0)
MCH: 29.6 pg (ref 26.0–34.0)
MCHC: 32.5 g/dL (ref 30.0–36.0)
MCV: 91.1 fL (ref 80.0–100.0)
Monocytes Absolute: 0.8 10*3/uL (ref 0.1–1.0)
Monocytes Relative: 8 %
Neutro Abs: 6.7 10*3/uL (ref 1.7–7.7)
Neutrophils Relative %: 69 %
Platelets: 362 10*3/uL (ref 150–400)
RBC: 4.73 MIL/uL (ref 3.87–5.11)
RDW: 13.9 % (ref 11.5–15.5)
WBC: 9.8 10*3/uL (ref 4.0–10.5)
nRBC: 0 % (ref 0.0–0.2)

## 2022-07-29 LAB — URINALYSIS, ROUTINE W REFLEX MICROSCOPIC
Bilirubin Urine: NEGATIVE
Glucose, UA: NEGATIVE mg/dL
Ketones, ur: NEGATIVE mg/dL
Leukocytes,Ua: NEGATIVE
Nitrite: NEGATIVE
Protein, ur: 30 mg/dL — AB
Specific Gravity, Urine: 1.019 (ref 1.005–1.030)
pH: 5 (ref 5.0–8.0)

## 2022-07-29 LAB — COMPREHENSIVE METABOLIC PANEL
ALT: 32 U/L (ref 0–44)
AST: 16 U/L (ref 15–41)
Albumin: 4 g/dL (ref 3.5–5.0)
Alkaline Phosphatase: 69 U/L (ref 38–126)
Anion gap: 9 (ref 5–15)
BUN: 17 mg/dL (ref 6–20)
CO2: 26 mmol/L (ref 22–32)
Calcium: 8.9 mg/dL (ref 8.9–10.3)
Chloride: 106 mmol/L (ref 98–111)
Creatinine, Ser: 0.66 mg/dL (ref 0.44–1.00)
GFR, Estimated: >60 mL/min (ref >60)
Glucose, Bld: 105 mg/dL — ABNORMAL HIGH (ref 70–99)
Potassium: 4 mmol/L (ref 3.5–5.1)
Sodium: 141 mmol/L (ref 135–145)
Total Bilirubin: <0.1 mg/dL — ABNORMAL LOW (ref 0.3–1.2)
Total Protein: 7.4 g/dL (ref 6.5–8.1)

## 2022-07-29 LAB — LIPASE, BLOOD: Lipase: 31 U/L (ref 11–51)

## 2022-07-29 LAB — PREGNANCY, URINE: Preg Test, Ur: NEGATIVE

## 2022-07-29 MED ORDER — KETOROLAC TROMETHAMINE 60 MG/2ML IM SOLN
30.0000 mg | Freq: Once | INTRAMUSCULAR | Status: AC
  Administered 2022-07-29: 30 mg via INTRAMUSCULAR
  Filled 2022-07-29: qty 2

## 2022-07-29 MED ORDER — KETOROLAC TROMETHAMINE 10 MG PO TABS: 10.0000 mg | ORAL_TABLET | Freq: Four times a day (QID) | ORAL | 0 refills | Status: AC | PRN

## 2022-07-29 MED ORDER — ACETAMINOPHEN 500 MG PO TABS
1000.0000 mg | ORAL_TABLET | Freq: Once | ORAL | Status: AC
  Administered 2022-07-29: 1000 mg via ORAL
  Filled 2022-07-29: qty 2

## 2022-07-29 MED ORDER — ACETAMINOPHEN 500 MG PO TABS: 1000.0000 mg | ORAL_TABLET | Freq: Once | ORAL | Status: DC

## 2022-07-29 NOTE — ED Triage Notes (Signed)
Pt BIB [facility] from home. Pt c/o being overdue with her pregnancy. Pt recently seen at [facility] for same and it was determined she isn't pregnant. Hx of drug abuse.

## 2022-07-29 NOTE — ED Notes (Signed)
Pt is refusing to go to CT as pt states "because I am pregnant". EDP made aware
